# Patient Record
Sex: Male | Born: 1945 | Race: White | Hispanic: No | Marital: Single | Smoking: Never smoker
Health system: Southern US, Community
[De-identification: ages and names within clinical notes are randomized; demographics above are authoritative.]

## PROBLEM LIST (undated history)

## (undated) DIAGNOSIS — R339 Retention of urine, unspecified: Secondary | ICD-10-CM

## (undated) DIAGNOSIS — I10 Essential (primary) hypertension: Secondary | ICD-10-CM

## (undated) DIAGNOSIS — N400 Enlarged prostate without lower urinary tract symptoms: Secondary | ICD-10-CM

## (undated) DIAGNOSIS — F419 Anxiety disorder, unspecified: Secondary | ICD-10-CM

## (undated) DIAGNOSIS — C4490 Unspecified malignant neoplasm of skin, unspecified: Secondary | ICD-10-CM

## (undated) HISTORY — PX: TONSILLECTOMY: SUR1361

## (undated) HISTORY — PX: MELANOMA EXCISION: SHX5266

---

## 2015-07-25 ENCOUNTER — Emergency Department
Admission: EM | Admit: 2015-07-25 | Discharge: 2015-07-25 | Discharge status: Against Medical Advice | Payer: Medicare Other | Attending: Emergency Medicine | Admitting: Emergency Medicine

## 2015-07-25 DIAGNOSIS — R197 Diarrhea, unspecified: Secondary | ICD-10-CM

## 2015-07-25 NOTE — ED Notes (Signed)
Pt to ed with c/o yellow stools and states "im concerned they should be brown" pt states this has been going on for about 4 days, "sometimes liquid, sometimes mushy looking, sometimes liquid with balls in it" per pt denies vomiting or pain

## 2015-07-25 NOTE — ED Provider Notes (Addendum)
[facility] Emergency Department Provider Note  ____________________________________________  Time seen: Approximately 10:10PM  I have reviewed the triage vital signs and the nursing notes.   HISTORY  Chief Complaint Diarrhea    HPI Samuel Pitts is a 69 y.o. male without any chronic medical conditions was presenting tonight for 2-3 episodes of diarrhea per day over the past 5 days. The patient denies any pain. Denies any shortness of breath. Denies any recent antibiotics, travel or hospitalizations. Denies any history of C. difficile. Denies any blood in his stool.Says his last episode of diarrhea was 2 hours ago. He started looking things up on the Internet about diarrhea and became concerned about liver cirrhosis. He denies any drinking or any history of IV drug use.   No past medical history on file.  There are no active problems to display for this patient.   No past surgical history on file.  No current outpatient prescriptions on file.  Allergies Review of patient's allergies indicates no known allergies.  No family history on file.  Social History Social History  Substance Use Topics  . Smoking status: Not on file  . Smokeless tobacco: Not on file  . Alcohol Use: Not on file    Review of Systems Constitutional: No fever/chills Eyes: No visual changes. ENT: No sore throat. Cardiovascular: Denies chest pain. Respiratory: Denies shortness of breath. Gastrointestinal: No abdominal pain.  No nausea, no vomiting.   No constipation. Genitourinary: Negative for dysuria. Musculoskeletal: Negative for back pain. Skin: Negative for rash. Neurological: Negative for headaches, focal weakness or numbness.  10-point ROS otherwise negative.  ____________________________________________   PHYSICAL EXAM:  VITAL SIGNS: ED Triage Vitals  Enc Vitals Group     BP 07/25/15 2028 145/71 mmHg     Pulse Rate 07/25/15 2028 94     Resp  07/25/15 2028 20     Temp 07/25/15 2028 97.5 F (36.4 C)     Temp Source 07/25/15 2028 Oral     SpO2 07/25/15 2028 98 %     Weight 07/25/15 2028 160 lb (72.576 kg)     Height 07/25/15 2028 5\' 9"  (1.753 m)     Head Cir --      Peak Flow --      Pain Score --      Pain Loc --      Pain Edu? --      Excl. in [place]? --     Constitutional: Alert and oriented. Well appearing and in no acute distress. Eyes: Conjunctivae are normal. PERRL. EOMI. Head: Atraumatic. Nose: No congestion/rhinnorhea. Mouth/Throat: Mucous membranes are moist.  Oropharynx non-erythematous. Neck: No stridor.   Cardiovascular: Normal rate, regular rhythm. Grossly normal heart sounds.  Good peripheral circulation. Respiratory: Normal respiratory effort.  No retractions. Lungs CTAB. Gastrointestinal: Soft and nontender. No distention. No abdominal bruits. No CVA tenderness. Musculoskeletal: No lower extremity tenderness nor edema.  No joint effusions. Neurologic:  Normal speech and language. No gross focal neurologic deficits are appreciated. No gait instability. Skin:  Skin is warm, dry and intact. No rash noted. Psychiatric: Mood and affect are normal. Speech and behavior are normal.  ____________________________________________   LABS (all labs ordered are listed, but only abnormal results are displayed)  Labs Reviewed  C DIFFICILE QUICK SCREEN W PCR REFLEX  STOOL CULTURE  GIARDIA, EIA; OVA/PARASITE  CBC WITH DIFFERENTIAL/PLATELET  COMPREHENSIVE METABOLIC PANEL  GI PATHOGEN PANEL BY PCR, STOOL   ____________________________________________  EKG   ____________________________________________  RADIOLOGY  ____________________________________________   PROCEDURES  ____________________________________________   INITIAL IMPRESSION / ASSESSMENT AND PLAN / ED COURSE  Pertinent labs & imaging results that were available during my care of the patient were reviewed by me and considered in my medical  decision making (see chart for details).  ----------------------------------------- 10:38 PM on 07/25/2015 -----------------------------------------  The nurse went into the room to draw blood and the patient became angry that it wasn't done when he was first triaged. He says that he thinks that he should've had blood drawn initially to expedite his emergency department workup. He is now wanting to leave [place]. I discussed with him my suspicion that he may have a serious bacterial infection in his diarrhea including C. difficile. We discussed that these illnesses can progress to kidney failure as well as permanent disability and death. The patient understands this. He is not clinically intoxicated and alert and oriented 3. He does not appear acutely psychotic. He has insight into his illness as well as the consequences of leaving [place]. He has decisional capacity at this time. He will be discharged [facility]. I encouraged him to return to the emergency department at his earliest convenience to continue his workup. At this time he has produced a small amount of stool in the hat in the toilet in the room. It is yellow and liquid like without any obvious blood. I will discuss with the nurse entering into the lab although it is only a very small amount on a small piece of toilet tissue and it does not appear that there will be enough to run tests from this. ____________________________________________   FINAL CLINICAL IMPRESSION(S) / ED DIAGNOSES  Acute diarrhea. Initial visit.    Orbie Pyo, MD 07/25/15 2240  Unable to print out Epic discharge instructions for [facility]. Copy of this form was made and placed on the patient's chart.    Orbie Pyo, MD 07/25/15 [phone]

## 2015-07-25 NOTE — ED Notes (Signed)
Pt left AMA states he isnt going to wait any longer "for nothing to be done" I explained to the pt we were going to do blood work however he refused to allow staff to take his blood

## 2015-07-25 NOTE — ED Notes (Signed)
Patients reports "yellow diarrhea for 4-5 days".

## 2018-09-15 ENCOUNTER — Inpatient Hospital Stay
Admission: EM | Admit: 2018-09-15 | Discharge: 2018-09-19 | DRG: 871 | Disposition: A | Discharge status: Home / Self Care | Payer: Medicare Other | Attending: Internal Medicine | Admitting: Internal Medicine

## 2018-09-15 ENCOUNTER — Other Ambulatory Visit: Payer: Self-pay

## 2018-09-15 ENCOUNTER — Emergency Department: Payer: Medicare Other

## 2018-09-15 DIAGNOSIS — R6521 Severe sepsis with septic shock: Secondary | ICD-10-CM | POA: Diagnosis present

## 2018-09-15 DIAGNOSIS — E872 Acidosis: Secondary | ICD-10-CM | POA: Diagnosis present

## 2018-09-15 DIAGNOSIS — G9341 Metabolic encephalopathy: Secondary | ICD-10-CM | POA: Diagnosis present

## 2018-09-15 DIAGNOSIS — N136 Pyonephrosis: Secondary | ICD-10-CM | POA: Diagnosis present

## 2018-09-15 DIAGNOSIS — A419 Sepsis, unspecified organism: Secondary | ICD-10-CM | POA: Diagnosis not present

## 2018-09-15 DIAGNOSIS — I248 Other forms of acute ischemic heart disease: Secondary | ICD-10-CM | POA: Diagnosis present

## 2018-09-15 DIAGNOSIS — K7200 Acute and subacute hepatic failure without coma: Secondary | ICD-10-CM | POA: Diagnosis present

## 2018-09-15 DIAGNOSIS — D638 Anemia in other chronic diseases classified elsewhere: Secondary | ICD-10-CM | POA: Diagnosis present

## 2018-09-15 DIAGNOSIS — Z79899 Other long term (current) drug therapy: Secondary | ICD-10-CM | POA: Diagnosis not present

## 2018-09-15 DIAGNOSIS — N19 Unspecified kidney failure: Secondary | ICD-10-CM

## 2018-09-15 DIAGNOSIS — Z85820 Personal history of malignant melanoma of skin: Secondary | ICD-10-CM | POA: Diagnosis not present

## 2018-09-15 DIAGNOSIS — R6520 Severe sepsis without septic shock: Secondary | ICD-10-CM

## 2018-09-15 DIAGNOSIS — Z888 Allergy status to other drugs, medicaments and biological substances status: Secondary | ICD-10-CM | POA: Diagnosis not present

## 2018-09-15 DIAGNOSIS — R627 Adult failure to thrive: Secondary | ICD-10-CM | POA: Diagnosis present

## 2018-09-15 DIAGNOSIS — E876 Hypokalemia: Secondary | ICD-10-CM | POA: Diagnosis present

## 2018-09-15 DIAGNOSIS — J189 Pneumonia, unspecified organism: Secondary | ICD-10-CM

## 2018-09-15 DIAGNOSIS — I959 Hypotension, unspecified: Secondary | ICD-10-CM | POA: Diagnosis present

## 2018-09-15 DIAGNOSIS — R509 Fever, unspecified: Secondary | ICD-10-CM

## 2018-09-15 DIAGNOSIS — A4151 Sepsis due to Escherichia coli [E. coli]: Secondary | ICD-10-CM | POA: Diagnosis present

## 2018-09-15 DIAGNOSIS — N179 Acute kidney failure, unspecified: Secondary | ICD-10-CM | POA: Diagnosis present

## 2018-09-15 HISTORY — DX: Unspecified malignant neoplasm of skin, unspecified: C44.90

## 2018-09-15 LAB — URINALYSIS, COMPLETE (UACMP) WITH MICROSCOPIC
BILIRUBIN URINE: NEGATIVE
Glucose, UA: NEGATIVE mg/dL
Ketones, ur: NEGATIVE mg/dL
Nitrite: NEGATIVE
PROTEIN: 30 mg/dL — AB
Specific Gravity, Urine: 1.005 (ref 1.005–1.030)
Squamous Epithelial / HPF: NONE SEEN (ref 0–5)
WBC, UA: >50 WBC/hpf — ABNORMAL HIGH (ref 0–5)
pH: 6 (ref 5.0–8.0)

## 2018-09-15 LAB — BLOOD GAS, VENOUS
ACID-BASE DEFICIT: 5.5 mmol/L — AB (ref 0.0–2.0)
Bicarbonate: 17.7 mmol/L — ABNORMAL LOW (ref 20.0–28.0)
O2 Saturation: 71.3 %
Patient temperature: 37
pCO2, Ven: 28 mmHg — ABNORMAL LOW (ref 44.0–60.0)
pH, Ven: 7.41 (ref 7.250–7.430)
pO2, Ven: 37 mmHg (ref 32.0–45.0)

## 2018-09-15 LAB — COMPREHENSIVE METABOLIC PANEL
ALT: 66 U/L — ABNORMAL HIGH (ref 0–44)
AST: 84 U/L — ABNORMAL HIGH (ref 15–41)
Albumin: 2.3 g/dL — ABNORMAL LOW (ref 3.5–5.0)
Alkaline Phosphatase: 488 U/L — ABNORMAL HIGH (ref 38–126)
Anion gap: 18 — ABNORMAL HIGH (ref 5–15)
BUN: 67 mg/dL — ABNORMAL HIGH (ref 8–23)
CO2: 17 mmol/L — ABNORMAL LOW (ref 22–32)
Calcium: 7.7 mg/dL — ABNORMAL LOW (ref 8.9–10.3)
Chloride: 101 mmol/L (ref 98–111)
Creatinine, Ser: 5.35 mg/dL — ABNORMAL HIGH (ref 0.61–1.24)
GFR calc Af Amer: 11 mL/min — ABNORMAL LOW (ref >60)
GFR, EST NON AFRICAN AMERICAN: 10 mL/min — AB (ref >60)
Glucose, Bld: 113 mg/dL — ABNORMAL HIGH (ref 70–99)
Potassium: 2.9 mmol/L — ABNORMAL LOW (ref 3.5–5.1)
Sodium: 136 mmol/L (ref 135–145)
TOTAL PROTEIN: 6.6 g/dL (ref 6.5–8.1)
Total Bilirubin: 2.1 mg/dL — ABNORMAL HIGH (ref 0.3–1.2)

## 2018-09-15 LAB — CBC WITH DIFFERENTIAL/PLATELET
Abs Immature Granulocytes: 0.5 10*3/uL — ABNORMAL HIGH (ref 0.00–0.07)
Band Neutrophils: 48 %
Basophils Absolute: 0 10*3/uL (ref 0.0–0.1)
Basophils Relative: 0 %
EOS ABS: 0 10*3/uL (ref 0.0–0.5)
Eosinophils Relative: 0 %
HEMATOCRIT: 36.3 % — AB (ref 39.0–52.0)
HEMOGLOBIN: 12 g/dL — AB (ref 13.0–17.0)
Lymphocytes Relative: 3 %
Lymphs Abs: 0.3 10*3/uL — ABNORMAL LOW (ref 0.7–4.0)
MCH: 29.1 pg (ref 26.0–34.0)
MCHC: 33.1 g/dL (ref 30.0–36.0)
MCV: 87.9 fL (ref 80.0–100.0)
Metamyelocytes Relative: 4 %
Monocytes Absolute: 0 10*3/uL — ABNORMAL LOW (ref 0.1–1.0)
Monocytes Relative: 0 %
Myelocytes: 1 %
Neutro Abs: 9.7 10*3/uL — ABNORMAL HIGH (ref 1.7–7.7)
Neutrophils Relative %: 44 %
Platelets: 248 10*3/uL (ref 150–400)
RBC: 4.13 MIL/uL — AB (ref 4.22–5.81)
RDW: 14.3 % (ref 11.5–15.5)
WBC: 10.5 10*3/uL (ref 4.0–10.5)
nRBC: 0 % (ref 0.0–0.2)

## 2018-09-15 LAB — INFLUENZA PANEL BY PCR (TYPE A & B)
Influenza A By PCR: NEGATIVE
Influenza B By PCR: NEGATIVE

## 2018-09-15 LAB — PROTIME-INR
INR: 1.42
Prothrombin Time: 17.2 seconds — ABNORMAL HIGH (ref 11.4–15.2)

## 2018-09-15 LAB — CG4 I-STAT (LACTIC ACID): Lactic Acid, Venous: 6.19 mmol/L (ref 0.5–1.9)

## 2018-09-15 LAB — CK: Total CK: 26 U/L — ABNORMAL LOW (ref 49–397)

## 2018-09-15 LAB — TROPONIN I: Troponin I: 0.18 ng/mL (ref <0.03)

## 2018-09-15 MED ORDER — DOCUSATE SODIUM 100 MG PO CAPS
100.0000 mg | ORAL_CAPSULE | Freq: Two times a day (BID) | ORAL | Status: DC
  Administered 2018-09-17 – 2018-09-19 (×3): 100 mg via ORAL
  Filled 2018-09-15 (×4): qty 1

## 2018-09-15 MED ORDER — ACETAMINOPHEN 650 MG RE SUPP
650.0000 mg | Freq: Once | RECTAL | Status: AC
  Administered 2018-09-15: 650 mg via RECTAL
  Filled 2018-09-15: qty 1

## 2018-09-15 MED ORDER — SODIUM CHLORIDE 0.9 % IV BOLUS (SEPSIS)
1000.0000 mL | Freq: Once | INTRAVENOUS | Status: AC
  Administered 2018-09-15: 1000 mL via INTRAVENOUS

## 2018-09-15 MED ORDER — ONDANSETRON HCL 4 MG/2ML IJ SOLN: 4.0000 mg | Freq: Four times a day (QID) | INTRAMUSCULAR | Status: DC | PRN

## 2018-09-15 MED ORDER — ACETAMINOPHEN 650 MG RE SUPP: 650.0000 mg | Freq: Four times a day (QID) | RECTAL | Status: DC | PRN

## 2018-09-15 MED ORDER — SODIUM CHLORIDE 0.9 % IV BOLUS (SEPSIS): 250.0000 mL | Freq: Once | INTRAVENOUS | Status: DC

## 2018-09-15 MED ORDER — HYDROCODONE-ACETAMINOPHEN 5-325 MG PO TABS: 1.0000 | ORAL_TABLET | ORAL | Status: DC | PRN

## 2018-09-15 MED ORDER — TRAZODONE HCL 50 MG PO TABS
25.0000 mg | ORAL_TABLET | Freq: Every evening | ORAL | Status: DC | PRN
  Administered 2018-09-18: 25 mg via ORAL
  Filled 2018-09-15: qty 1

## 2018-09-15 MED ORDER — HEPARIN SODIUM (PORCINE) 5000 UNIT/ML IJ SOLN
5000.0000 [IU] | Freq: Three times a day (TID) | INTRAMUSCULAR | Status: DC
  Administered 2018-09-16 – 2018-09-19 (×10): 5000 [IU] via SUBCUTANEOUS
  Filled 2018-09-15 (×12): qty 1

## 2018-09-15 MED ORDER — SODIUM CHLORIDE 0.9 % IV BOLUS: 500.0000 mL | Freq: Once | INTRAVENOUS | Status: DC

## 2018-09-15 MED ORDER — SODIUM CHLORIDE 0.9 % IV SOLN
2.0000 g | Freq: Once | INTRAVENOUS | Status: AC
  Administered 2018-09-15: 2 g via INTRAVENOUS
  Filled 2018-09-15: qty 2

## 2018-09-15 MED ORDER — BISACODYL 5 MG PO TBEC: 5.0000 mg | DELAYED_RELEASE_TABLET | Freq: Every day | ORAL | Status: DC | PRN

## 2018-09-15 MED ORDER — SODIUM CHLORIDE 0.9 % IV SOLN
INTRAVENOUS | Status: DC
  Administered 2018-09-16 (×3): via INTRAVENOUS
  Administered 2018-09-16 – 2018-09-17 (×2): 150 mL/h via INTRAVENOUS
  Administered 2018-09-17: 04:00:00 via INTRAVENOUS
  Administered 2018-09-17: 150 mL/h via INTRAVENOUS
  Administered 2018-09-18 – 2018-09-19 (×2): via INTRAVENOUS

## 2018-09-15 MED ORDER — METRONIDAZOLE IN NACL 5-0.79 MG/ML-% IV SOLN
500.0000 mg | Freq: Three times a day (TID) | INTRAVENOUS | Status: DC
  Administered 2018-09-15 – 2018-09-16 (×2): 500 mg via INTRAVENOUS
  Filled 2018-09-15 (×4): qty 100

## 2018-09-15 MED ORDER — ACETAMINOPHEN 325 MG PO TABS
650.0000 mg | ORAL_TABLET | Freq: Four times a day (QID) | ORAL | Status: DC | PRN
  Administered 2018-09-16 – 2018-09-19 (×3): 650 mg via ORAL
  Filled 2018-09-15 (×3): qty 2

## 2018-09-15 MED ORDER — VANCOMYCIN HCL IN DEXTROSE 1-5 GM/200ML-% IV SOLN
1000.0000 mg | Freq: Once | INTRAVENOUS | Status: AC
  Administered 2018-09-15: 1000 mg via INTRAVENOUS
  Filled 2018-09-15: qty 200

## 2018-09-15 MED ORDER — ONDANSETRON HCL 4 MG PO TABS: 4.0000 mg | ORAL_TABLET | Freq: Four times a day (QID) | ORAL | Status: DC | PRN

## 2018-09-15 MED ORDER — SODIUM CHLORIDE 0.9 % IV SOLN
1.0000 g | INTRAVENOUS | Status: DC
  Filled 2018-09-15: qty 1

## 2018-09-15 MED ORDER — SODIUM CHLORIDE 0.9 % IV BOLUS
750.0000 mL | Freq: Once | INTRAVENOUS | Status: AC
  Administered 2018-09-15: 750 mL via INTRAVENOUS

## 2018-09-15 NOTE — Progress Notes (Signed)
Pharmacy Antibiotic Note  Samuel Pitts is a 72 y.o. male admitted on 09/15/2018 with sepsis s/t UTI.  Pharmacy has been consulted for vanc/cefepime dosing. Patient admitted w/ weakness and meeting 3/4 SIRS + qSOFA 3/3 (systolic < 590, RR > 22, AMS) and LA of 6.19 and is in AKI (baseline Scr 0.93 from [facility] -- here it is 5.35). Patient received vanc 1g, cefepime 2g, and flagyl 500 mg IV x 1  Plan: Considering patient has AKI will draw vanc random w/ am labs and will pulse dose for now until renal function can stabilize  Will dose w/ vanc 15 mg/kg IV x 1 if random at goal < 20 mcg/mL. Will continue cefepime 1g IV q24h per CrCl 11 - 29 ml/min  Height: 6' (182.9 cm) Weight: 165 lb (74.8 kg) IBW/kg (Calculated) : 77.6  Temp (24hrs), Avg:102.2 F (39 C), Min:101.2 F (38.4 C), Max:104.8 F (40.4 C)  Recent Labs  Lab 09/15/18 2123 09/15/18 2129  WBC  --  10.5  CREATININE  --  5.35*  LATICACIDVEN 6.19*  --     Estimated Creatinine Clearance: 13 mL/min (A) (by C-G formula based on SCr of 5.35 mg/dL (H)).    Allergies  Allergen Reactions  . Omeprazole     SEVERE STOMACH PAIN    Thank you for allowing pharmacy to be a part of this patient's care.  Tobie Lords, PharmD, BCPS Clinical Pharmacist 09/15/2018

## 2018-09-15 NOTE — ED Notes (Signed)
Pt cleansed of incontinent stool. Liquid dark brown diarrhea noted.

## 2018-09-15 NOTE — ED Provider Notes (Addendum)
[facility] Emergency Department Provider Note  ____________________________________________   I have reviewed the triage vital signs and the nursing notes. Where available I have reviewed prior notes and, if possible and indicated, outside hospital notes.    HISTORY  Chief Complaint Weakness    HPI Samuel Pitts is a 72 y.o. male presents today with EMS.  He is a very limited historian.  He states he has been feeling weak for the last week but does not really provide any other information.  EMS states that he he was dying on the couch.  No trauma.  He was found to be febrile.  Family had very poor explanation for what is been going on.  Patient apparently had been lying in his own urine and fecal matter.  Unclear how long he has been like that.  Patient cannot give a reliable history.  States he has been there for "a week" he denies any other symptoms he denies chest pain shortness of breath nausea vomiting diarrhea.  Rhinorrhea, headache stiff neck etc.  It is unclear exactly what this patient's baseline is.  No family are with him.  EMS states that the family were not helpful in the history in any event.  Initial blood pressure was low for EMS they gave him 750 cc of IV fluid.   History reviewed. No pertinent past medical history.  There are no active problems to display for this patient.   History reviewed. No pertinent surgical history.  Prior to Admission medications   Not on File    Allergies Patient has no known allergies.  History reviewed. No pertinent family history.  Social History Social History   Tobacco Use  . Smoking status: Never Smoker  . Smokeless tobacco: Never Used  Substance Use Topics  . Alcohol use: Never    Frequency: Never  . Drug use: Never    Review of Systems Constitutional: Positive fever today eyes: No visual changes. ENT: No sore throat. No stiff neck no neck pain Cardiovascular: Denies chest  pain. Respiratory: Denies shortness of breath. Gastrointestinal:   no vomiting.  No diarrhea.  No constipation. Genitourinary: Negative for dysuria. Musculoskeletal: Negative lower extremity swelling Skin: Negative for rash. Neurological: Negative for severe headaches, focal weakness or numbness. Unclear how reliable this history is but this is what he tells me  ____________________________________________   PHYSICAL EXAM:  VITAL SIGNS: ED Triage Vitals  Enc Vitals Group     BP 09/15/18 2119 (!) 89/44     Pulse Rate 09/15/18 2120 (!) 135     Resp 09/15/18 2119 (!) 21     Temp 09/15/18 2132 (!) 104.8 F (40.4 C)     Temp Source 09/15/18 2132 Rectal     SpO2 09/15/18 2119 98 %     Weight 09/15/18 2124 165 lb (74.8 kg)     Height 09/15/18 2124 6' (1.829 m)     Head Circumference --      Peak Flow --      Pain Score --      Pain Loc --      Pain Edu? --      Excl. in [place]? --     Constitutional: Alert and oriented to name and place unsure of the date. Well appearing and in no acute distress.  Feels warm.  Will follow commands and answer questions.  Asking about his wife. Eyes: Conjunctivae are normal Head: Atraumatic HEENT: No congestion/rhinnorhea. Mucous membranes are very dry.  Oropharynx non-erythematous Neck:  Nontender with no meningismus, no masses, no stridor Cardiovascular: Tachycardia noted.  Grossly normal heart sounds.  Good peripheral circulation. Respiratory: Normal respiratory effort.  No retractions. Lungs CTAB. Abdominal: Soft and left lower quadrant discomfort nonsurgical. No distention. No guarding no rebound Back:  There is no focal tenderness or step off.  there is no midline tenderness there are no lesions noted. there is no CVA tenderness External male genitalia no obvious lesions noted Musculoskeletal: No lower extremity tenderness, no upper extremity tenderness. No joint effusions, no DVT signs strong distal pulses no edema Neurologic:  Normal speech  and language. No gross focal neurologic deficits are appreciated.  Skin:  Skin is warm, dry and intact. No rash noted.   ____________________________________________   LABS (all labs ordered are listed, but only abnormal results are displayed)  Labs Reviewed  CG4 I-STAT (LACTIC ACID) - Abnormal; Notable for the following components:      Result Value   Lactic Acid, Venous 6.19 (*)    All other components within normal limits  CULTURE, BLOOD (ROUTINE X 2)  CULTURE, BLOOD (ROUTINE X 2)  URINE CULTURE  C DIFFICILE QUICK SCREEN W PCR REFLEX  GASTROINTESTINAL PANEL BY PCR, STOOL (REPLACES STOOL CULTURE)  CK  LACTIC ACID, PLASMA  LACTIC ACID, PLASMA  COMPREHENSIVE METABOLIC PANEL  CBC WITH DIFFERENTIAL/PLATELET  URINALYSIS, ROUTINE W REFLEX MICROSCOPIC  BLOOD GAS, VENOUS  PROTIME-INR  URINALYSIS, COMPLETE (UACMP) WITH MICROSCOPIC  TROPONIN I  INFLUENZA PANEL BY PCR (TYPE A & B)    Pertinent labs  results that were available during my care of the patient were reviewed by me and considered in my medical decision making (see chart for details). ____________________________________________  EKG  I personally interpreted any EKGs ordered by me or triage Tachycardia noted, sinus rate 138, normal axis likely rate related changes no acute ischemia ____________________________________________  RADIOLOGY  Pertinent labs & imaging results that were available during my care of the patient were reviewed by me and considered in my medical decision making (see chart for details). If possible, patient and/or family made aware of any abnormal findings.  No results found. ____________________________________________    PROCEDURES  Procedure(s) performed: None  Procedures  Critical Care performed: CRITICAL CARE Performed by: Schuyler Amor   Total critical care time: 60 minutes  Critical care time was exclusive of separately billable procedures and treating other  patients.  Critical care was necessary to treat or prevent imminent or life-threatening deterioration.  Critical care was time spent personally by me on the following activities: development of treatment plan with patient and/or surrogate as well as nursing, discussions with consultants, evaluation of patient's response to treatment, examination of patient, obtaining history from patient or surrogate, ordering and performing treatments and interventions, ordering and review of laboratory studies, ordering and review of radiographic studies, pulse oximetry and re-evaluation of patient's condition.   ____________________________________________   INITIAL IMPRESSION / ASSESSMENT AND PLAN / ED COURSE  Pertinent labs & imaging results that were available during my care of the patient were reviewed by me and considered in my medical decision making (see chart for details).  Patient here after being "weak" for a week, unclear exactly what other symptoms he has been having as he is very vague about this.  He has had some diarrhea since he is been here we will send that for C. difficile and bio fire if we get another sample.  He is apparently septic he has a significant tachycardia initial blood pressure for EMS  was quite low, they have already given him 750 cc of fluid.  I have initiated aggressive sepsis protocol, we will give him a full bolus here, I have initiated antibiotics, broad-spectrum for sepsis, will get a chest x-ray.  He does have some slight tenderness in the left lower quadrant I will also do a CT scan when he is stable enough to go over. Patient is significantly ill and will require admission.  I will place a Foley catheter to keep track of I's and O's  ----------------------------------------- 11:26 PM on 09/15/2018 -----------------------------------------  Patient's heart rate is down to the 120s at this time, he is still mentating well, has evidence of urinary tract infection acute  kidney injury evidence of shock liver, evidence of demand ischemia evidence of severe sepsis.  However, blood pressure is trending up with IV fluids he is making urine, I think this is likely urinary origin.  CT scan reviewed.  She has been admitted to the hospitalist service for ICU care.  No indication for pressors at this time.  He has been aggressively IV hydrated.  We will continue to do so he is under the care of the hospitalist service Dr. Doyle Askew.   ____________________________________________   FINAL CLINICAL IMPRESSION(S) / ED DIAGNOSES  Final diagnoses:  Fever      This chart was dictated using voice recognition software.  Despite best efforts to proofread,  errors can occur which can change meaning.      Schuyler Amor, MD 09/15/18 2154    Schuyler Amor, MD 09/15/18 2328

## 2018-09-15 NOTE — ED Notes (Signed)
Pt leaving for imaging.

## 2018-09-15 NOTE — ED Notes (Addendum)
2 sets of cultures taken. L arm pulled by April, RN. R arm pulled by 2nd float RN. Rainbow pulled. Flu panel sent.

## 2018-09-15 NOTE — ED Notes (Signed)
Foley cath in place; April & Lorrie, RN.

## 2018-09-15 NOTE — Progress Notes (Signed)
CODE SEPSIS - PHARMACY COMMUNICATION  **Broad Spectrum Antibiotics should be administered within 1 hour of Sepsis diagnosis**  Time Code Sepsis Called/Page Received:  12/6 @ 21:30   Antibiotics Ordered:  Vancomycin, Cefepime , Metronidazole   Time of 1st antibiotic administration:  12/6 @ 21:52   Additional action taken by pharmacy:   If necessary, Name of Provider/Nurse Contacted:      Rainee Sweatt D ,PharmD Clinical Pharmacist  09/15/2018  10:31 PM

## 2018-09-15 NOTE — ED Triage Notes (Signed)
Pt brought in via EMS from home. Recently dx with H pylori. Had fever when picked up. BP 83/53, HR 140, RR 40, BG 123 per EMS. Pt sitting in own feces. 750cc bolus given by EMS. Pt alert but not a good historian.

## 2018-09-15 NOTE — ED Notes (Signed)
EDP McShane notified in person of Trop 0.18.

## 2018-09-15 NOTE — H&P (Addendum)
[facility] [place] NAME: Samuel Pitts    MR#:  [id number]  DATE OF BIRTH:  1946/09/28  DATE OF ADMISSION:  09/15/2018  PRIMARY CARE PHYSICIAN: Patient, No Pcp Per   REQUESTING/REFERRING PHYSICIAN:   CHIEF COMPLAINT:   Chief Complaint  Patient presents with  . Weakness    HISTORY OF PRESENT ILLNESS: Samuel Pitts  is a 72 y.o. male with a known history of skin cancer. Patient is unable to provide history, due to altered mental status.  Information was taken from reviewing the medical records and from discussion with emergency room physician and EMS team. Patient was brought to emergency room for altered mental status and lethargy.  Paramedics found him laying on a couch at home, in his own urine and fecal matter.  Per EMS report, the family member who was with the patient at home seemed to have some sort of mental disability, unable to provide history. At the arrival to emergency room patient was found with tachycardia, fever (101.7), tachypnea, confusion and lethargy.  Blood pressure was on the low side at 100/50. Blood test done in emergency room are notable for elevated creatinine level at 5.35, potassium 2.9, troponin 0 0.18, lactic acid level at 6.  Liver function tests are mildly elevated as well, including AST ALT and bilirubin level.  UA strongly positive for UTI. Marked bilateral hydroureteronephrosis is noted per CAT scan, extending to the urinary bladder where it is circumferentially thick-walled up to 12 mm.   She is admitted to ICU for further evaluation and management.  PAST MEDICAL HISTORY:  History reviewed. No pertinent past medical history.  PAST SURGICAL HISTORY: History reviewed. No pertinent surgical history.  SOCIAL HISTORY:  Social History   Tobacco Use  . Smoking status: Never Smoker  . Smokeless tobacco: Never Used  Substance Use Topics  . Alcohol use: Never    Frequency: Never    FAMILY  HISTORY: History reviewed. No pertinent family history.  DRUG ALLERGIES:  Allergies  Allergen Reactions  . Omeprazole     SEVERE STOMACH PAIN    REVIEW OF SYSTEMS:   Unable to obtain due to patient being confused, lethargic.  MEDICATIONS AT HOME:  Prior to Admission medications   Medication Sig Start Date End Date Taking? Authorizing Provider  amoxicillin (AMOXIL) 500 MG capsule Take 1,000 mg by mouth 2 (two) times daily.    [provider]  clarithromycin (BIAXIN) 500 MG tablet Take 500 mg by mouth 2 (two) times daily. 14 DAYS    [provider]  metroNIDAZOLE (FLAGYL) 500 MG tablet Take 500 mg by mouth 2 (two) times daily.    [provider]  omeprazole (PRILOSEC) 40 MG capsule Take 40 mg by mouth daily.    [provider]      PHYSICAL EXAMINATION:   VITAL SIGNS: Blood pressure (!) 107/59, pulse (!) 133, temperature (!) 101.7 F (38.7 C), resp. rate (!) 26, height 6' (1.829 m), weight 74.8 kg, SpO2 98 %.  GENERAL:  72 y.o.-year-old patient lying in the bed, confused, lethargic.  EYES: Pupils equal, round, reactive to light and accommodation.  Bilateral scleral icterus noted.  HEENT: Head atraumatic, normocephalic. Oropharynx and nasopharynx clear.  NECK:  Supple, no jugular venous distention. No thyroid enlargement, no tenderness.  LUNGS: Tachypnea present.  Reduced breath sounds bilaterally, no wheezing, rales,rhonchi or crepitation. No use of accessory muscles of respiration.  CARDIOVASCULAR: Tachycardia present.  S1, S2 normal. No S3/S4.  ABDOMEN: Soft, nontender, nondistended. Bowel sounds present. No organomegaly or mass.  EXTREMITIES: No pedal edema, cyanosis, or clubbing.  NEUROLOGIC EXAM: Is limited, due to patient's altered mental status.  He is moving all his extremities, no focal weakness appreciated. PSYCHIATRIC: The patient is confused, lethargic.  SKIN: Skin is jaundiced.  No obvious rash, lesion, or ulcer.   LABORATORY  PANEL:   CBC Recent Labs  Lab 09/15/18 2129  WBC 10.5  HGB 12.0*  HCT 36.3*  PLT 248  MCV 87.9  MCH 29.1  MCHC 33.1  RDW 14.3  LYMPHSABS 0.3*  MONOABS 0.0*  EOSABS 0.0  BASOSABS 0.0   ------------------------------------------------------------------------------------------------------------------  Chemistries  Recent Labs  Lab 09/15/18 2129  NA 136  K 2.9*  CL 101  CO2 17*  GLUCOSE 113*  BUN 67*  CREATININE 5.35*  CALCIUM 7.7*  AST 84*  ALT 66*  ALKPHOS 488*  BILITOT 2.1*   ------------------------------------------------------------------------------------------------------------------ estimated creatinine clearance is 13 mL/min (A) (by C-G formula based on SCr of 5.35 mg/dL (H)). ------------------------------------------------------------------------------------------------------------------ No results for input(s): TSH, T4TOTAL, T3FREE, THYROIDAB in the last 72 hours.  Invalid input(s): FREET3   Coagulation profile Recent Labs  Lab 09/15/18 2129  INR 1.42   ------------------------------------------------------------------------------------------------------------------- No results for input(s): DDIMER in the last 72 hours. -------------------------------------------------------------------------------------------------------------------  Cardiac Enzymes Recent Labs  Lab 09/15/18 2129  TROPONINI 0.18*   ------------------------------------------------------------------------------------------------------------------ Invalid input(s): POCBNP  ---------------------------------------------------------------------------------------------------------------  Urinalysis    Component Value Date/Time   COLORURINE YELLOW (A) 09/15/2018 2202   APPEARANCEUR CLOUDY (A) 09/15/2018 2202   LABSPEC 1.005 09/15/2018 2202   PHURINE 6.0 09/15/2018 2202   GLUCOSEU NEGATIVE 09/15/2018 2202   HGBUR MODERATE (A) 09/15/2018 2202   BILIRUBINUR NEGATIVE  09/15/2018 2202   KETONESUR NEGATIVE 09/15/2018 2202   PROTEINUR 30 (A) 09/15/2018 2202   NITRITE NEGATIVE 09/15/2018 2202   LEUKOCYTESUR LARGE (A) 09/15/2018 2202     RADIOLOGY: Ct Chest Wo Contrast  Result Date: 09/15/2018 CLINICAL DATA:  Weakness for the past week with fever. Patient has apparently been laying in his own urine and feces for on certain length of time. EXAM: CT CHEST, ABDOMEN AND PELVIS WITHOUT CONTRAST TECHNIQUE: Multidetector CT imaging of the chest, abdomen and pelvis was performed following the standard protocol without IV contrast. COMPARISON:  None. FINDINGS: CT CHEST FINDINGS Cardiovascular: Conventional branch pattern of the great vessels. Atherosclerosis is noted at the origin of the left subclavian artery. 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 Normal heart size without pericardial effusion. Left main and three-vessel coronary arteriosclerosis predominantly along the left main and LAD. The unenhanced pulmonary vasculature is unremarkable. Mediastinum/Nodes: There is mediastinal shift to the right from volume loss. No lymphadenopathy. Trachea and mainstem bronchi appear patent. The esophagus is unremarkable. The thyroid gland is not enlarged. 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 No pneumothorax. Subpleural areas of scarring at the lung bases. No dominant mass. Minimal scarring at the left lung apex. Musculoskeletal: Remote right-sided rib fractures. No acute nor suspicious osseous lesions. CT ABDOMEN PELVIS FINDINGS Hepatobiliary: No focal liver abnormality is seen. No gallstones, gallbladder wall thickening, or biliary dilatation. Pancreas: Unremarkable. No pancreatic ductal dilatation or surrounding inflammatory changes. Spleen: Normal in size without focal abnormality. Adrenals/Urinary Tract: Normal bilateral adrenal glands. Marked bilateral  hydroureteronephrosis extending to the urinary bladder where it is circumferentially thick-walled in appearance up to 12 mm. The urinary bladder is decompressed by an indwelling  Foley catheter. A discrete mass or calculus is not identified however. Stomach/Bowel: The stomach is physiologically distended. Normal small bowel rotation without obstruction or inflammation. Distal and terminal ileum are unremarkable. Normal appearing appendix is visualized. No bowel obstruction or inflammation. Vascular/Lymphatic: Aortoiliac atherosclerosis. No aneurysm. No adenopathy. Reproductive: Within normal limits appearance of the prostate gland. Seminal vesicles are unremarkable. Other: Fat containing inguinal hernia on the left. No abdominopelvic ascites. Musculoskeletal: No aggressive osseous lesions. 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 IMPRESSION: 1. 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 2. 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 Recommend annual imaging followup by CTA or MRA. This recommendation follows 2010 ACCF/AHA/AATS/ACR/ASA/SCA/SCAI/SIR/STS/SVM Guidelines for the Diagnosis and Management of Patients with Thoracic Aortic Disease. 2010; 121: Z660-Y301. 3. Marked bilateral hydroureteronephrosis extending to the urinary bladder where it is circumferentially thick-walled up to 12 mm. The urinary bladder is decompressed by indwelling Foley catheter. A discrete mass or calculus is not identified however. Findings may be due to mural thickening possibly from chronic cystitis. 4. Fat containing left inguinal hernia. 5. 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 Aortic Atherosclerosis (ICD10-I70.0). Electronically Signed   By: Ashley Royalty M.D.   On: 09/15/2018 23:20   Dg Chest Port 1 View  Result Date: 09/15/2018 CLINICAL DATA:  Fever. EXAM: PORTABLE CHEST 1 VIEW COMPARISON:  None. FINDINGS: Cardiomediastinal silhouette  is normal. Trachea shifted to the RIGHT with RIGHT apical pleural thickening. Bilateral calcified pleural plaques and RIGHT lung pleural thickening. No pleural effusion or focal consolidation. Small potential pneumothorax. Soft tissue planes and included osseous structures are non suspicious. IMPRESSION: 1. Small potential RIGHT pneumothorax. Calcified pleural plaques which may obscure underlying acute process. RIGHT apical pleural thickening and scarring. 2. Acute findings discussed with and reconfirmed by Dr.JAMES MCSHANE on 09/15/2018 at 10:19 pm. Electronically Signed   By: Elon Alas M.D.   On: 09/15/2018 22:22   Ct Renal Stone Study  Result Date: 09/15/2018 CLINICAL DATA:  Weakness for the past week with fever. Patient has apparently been laying in his own urine and feces for on certain length of time. EXAM: CT CHEST, ABDOMEN AND PELVIS WITHOUT CONTRAST TECHNIQUE: Multidetector CT imaging of the chest, abdomen and pelvis was performed following the standard protocol without IV contrast. COMPARISON:  None. FINDINGS: CT CHEST FINDINGS Cardiovascular: Conventional branch pattern of the great vessels. Atherosclerosis is noted at the origin of the left subclavian artery. 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 Normal heart size without pericardial effusion. Left main and three-vessel coronary arteriosclerosis predominantly along the left main and LAD. The unenhanced pulmonary vasculature is unremarkable. Mediastinum/Nodes: There is mediastinal shift to the right from volume loss. No lymphadenopathy. Trachea and mainstem bronchi appear patent. The esophagus is unremarkable. The thyroid gland is not enlarged. 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 No pneumothorax. Subpleural areas of scarring at the lung bases. No dominant mass. Minimal scarring at the left lung apex.  Musculoskeletal: Remote right-sided rib fractures. No acute nor suspicious osseous lesions. CT ABDOMEN PELVIS FINDINGS Hepatobiliary: No focal liver abnormality is seen. No gallstones, gallbladder wall thickening, or biliary dilatation. Pancreas: Unremarkable. No pancreatic ductal dilatation or surrounding inflammatory changes. Spleen: Normal in size without focal abnormality. Adrenals/Urinary Tract: Normal bilateral adrenal glands. Marked bilateral hydroureteronephrosis extending to the urinary bladder where it is circumferentially thick-walled in appearance up to 12  mm. The urinary bladder is decompressed by an indwelling Foley catheter. A discrete mass or calculus is not identified however. Stomach/Bowel: The stomach is physiologically distended. Normal small bowel rotation without obstruction or inflammation. Distal and terminal ileum are unremarkable. Normal appearing appendix is visualized. No bowel obstruction or inflammation. Vascular/Lymphatic: Aortoiliac atherosclerosis. No aneurysm. No adenopathy. Reproductive: Within normal limits appearance of the prostate gland. Seminal vesicles are unremarkable. Other: Fat containing inguinal hernia on the left. No abdominopelvic ascites. Musculoskeletal: No aggressive osseous lesions. 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 IMPRESSION: 1. 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 2. 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 Recommend annual imaging followup by CTA or MRA. This recommendation follows 2010 ACCF/AHA/AATS/ACR/ASA/SCA/SCAI/SIR/STS/SVM Guidelines for the Diagnosis and Management of Patients with Thoracic Aortic Disease. 2010; 121: Z610-R604. 3. Marked bilateral hydroureteronephrosis extending to the urinary bladder where it is circumferentially thick-walled up to 12 mm. The urinary bladder is decompressed by indwelling Foley catheter. A discrete mass or calculus  is not identified however. Findings may be due to mural thickening possibly from chronic cystitis. 4. Fat containing left inguinal hernia. 5. 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 Aortic Atherosclerosis (ICD10-I70.0). Electronically Signed   By: Ashley Royalty M.D.   On: 09/15/2018 23:20    EKG: Orders placed or performed during the hospital encounter of 09/15/18  . ED EKG 12-Lead  . ED EKG 12-Lead  . EKG 12-Lead  . EKG 12-Lead    IMPRESSION AND PLAN:  1.  Sepsis, likely secondary to urinary infection.  We will start treatment with IV fluids and broad-spectrum IV antibiotics; oxygen as needed to maintain oxygen saturation above 92%.  Continue to follow urine and blood cultures results and lactic acid levels. 2.  Acute UTI with bilateral hydroureteronephrosis.  Will start treatment with broad-spectrum IV antibiotics, IV fluids.  Follow urine culture results.  Will consult urology and nephrology for further evaluation and treatment. 3.  Acute renal failure.  Creatinine level is markedly elevated at 5.35 in this patient with normal creatinine level at baseline.  This is likely secondary to urinary infection and bilateral hydroureteronephrosis.  We will start IV fluids and IV antibiotics and evaluate patient for urinary tract obstructive etiology. 4.  Acute metabolic encephalopathy, secondary to urinary infection and uremia from acute renal failure.  Continue to monitor clinically closely while treating the underlying disorder. 5.  Elevated troponin level, at 0.18.  This likely related to demand ischemia from sepsis.  No acute ischemic changes noted per EKG.  We will continue to monitor patient on telemetry and follow troponin levels to rule out ACS. 6.  Shock liver, secondary to sepsis.  We will continue to monitor liver function test, while treating the sepsis. 7.  Hypokalemia.  Will replace potassium per protocol.  All the records are reviewed and case discussed with ED and ICU  providers. Management plans discussed with the patient, family and they are in agreement.  CODE STATUS: Full Advance Directive Documentation     Most Recent Value  Type of Advance Directive  Healthcare Power of Attorney  Pre-existing out of facility DNR order (yellow form or pink MOST form)  -  "MOST" Form in Place?  -       TOTAL TIME TAKING CARE OF THIS PATIENT: 55 minutes.    Amelia Jo M.D on 09/15/2018 at 11:45 PM  Between 7am to 6pm - Pager - [phone]  After 6pm go to  [email or web address] - password [email or web address] [facility]  at [facility]  [phone]  CC: Primary care physician; Patient, No Pcp Per

## 2018-09-16 ENCOUNTER — Inpatient Hospital Stay: Payer: Self-pay

## 2018-09-16 ENCOUNTER — Inpatient Hospital Stay: Payer: Medicare Other

## 2018-09-16 ENCOUNTER — Encounter: Payer: Self-pay | Admitting: Adult Health

## 2018-09-16 DIAGNOSIS — A419 Sepsis, unspecified organism: Secondary | ICD-10-CM

## 2018-09-16 DIAGNOSIS — N132 Hydronephrosis with renal and ureteral calculous obstruction: Secondary | ICD-10-CM

## 2018-09-16 LAB — COMPREHENSIVE METABOLIC PANEL
ALT: 63 U/L — ABNORMAL HIGH (ref 0–44)
AST: 90 U/L — ABNORMAL HIGH (ref 15–41)
Albumin: 1.9 g/dL — ABNORMAL LOW (ref 3.5–5.0)
Alkaline Phosphatase: 392 U/L — ABNORMAL HIGH (ref 38–126)
Anion gap: 15 (ref 5–15)
BILIRUBIN TOTAL: 2.1 mg/dL — AB (ref 0.3–1.2)
BUN: 64 mg/dL — ABNORMAL HIGH (ref 8–23)
CO2: 17 mmol/L — ABNORMAL LOW (ref 22–32)
Calcium: 7 mg/dL — ABNORMAL LOW (ref 8.9–10.3)
Chloride: 107 mmol/L (ref 98–111)
Creatinine, Ser: 5.17 mg/dL — ABNORMAL HIGH (ref 0.61–1.24)
GFR calc Af Amer: 12 mL/min — ABNORMAL LOW (ref >60)
GFR calc non Af Amer: 10 mL/min — ABNORMAL LOW (ref >60)
Glucose, Bld: 112 mg/dL — ABNORMAL HIGH (ref 70–99)
Potassium: 3 mmol/L — ABNORMAL LOW (ref 3.5–5.1)
Sodium: 139 mmol/L (ref 135–145)
TOTAL PROTEIN: 5.5 g/dL — AB (ref 6.5–8.1)

## 2018-09-16 LAB — RENAL FUNCTION PANEL
Albumin: 1.6 g/dL — ABNORMAL LOW (ref 3.5–5.0)
Albumin: 1.8 g/dL — ABNORMAL LOW (ref 3.5–5.0)
Albumin: 1.9 g/dL — ABNORMAL LOW (ref 3.5–5.0)
Albumin: 1.8 g/dL — ABNORMAL LOW (ref 3.5–5.0)
Anion gap: 13 (ref 5–15)
Anion gap: 10 (ref 5–15)
Anion gap: 10 (ref 5–15)
Anion gap: 9 (ref 5–15)
BUN: 61 mg/dL — ABNORMAL HIGH (ref 8–23)
BUN: 45 mg/dL — ABNORMAL HIGH (ref 8–23)
BUN: 42 mg/dL — ABNORMAL HIGH (ref 8–23)
BUN: 36 mg/dL — ABNORMAL HIGH (ref 8–23)
CO2: 15 mmol/L — ABNORMAL LOW (ref 22–32)
CO2: 21 mmol/L — AB (ref 22–32)
CO2: 20 mmol/L — ABNORMAL LOW (ref 22–32)
CO2: 22 mmol/L (ref 22–32)
CREATININE: 3.42 mg/dL — AB (ref 0.61–1.24)
Calcium: 6.4 mg/dL — CL (ref 8.9–10.3)
Calcium: 6.8 mg/dL — ABNORMAL LOW (ref 8.9–10.3)
Calcium: 7.2 mg/dL — ABNORMAL LOW (ref 8.9–10.3)
Calcium: 7.2 mg/dL — ABNORMAL LOW (ref 8.9–10.3)
Chloride: 108 mmol/L (ref 98–111)
Chloride: 108 mmol/L (ref 98–111)
Chloride: 109 mmol/L (ref 98–111)
Chloride: 109 mmol/L (ref 98–111)
Creatinine, Ser: 5.14 mg/dL — ABNORMAL HIGH (ref 0.61–1.24)
Creatinine, Ser: 3.14 mg/dL — ABNORMAL HIGH (ref 0.61–1.24)
Creatinine, Ser: 2.7 mg/dL — ABNORMAL HIGH (ref 0.61–1.24)
GFR calc Af Amer: 12 mL/min — ABNORMAL LOW (ref >60)
GFR calc Af Amer: 22 mL/min — ABNORMAL LOW (ref >60)
GFR calc Af Amer: 26 mL/min — ABNORMAL LOW (ref >60)
GFR calc non Af Amer: 17 mL/min — ABNORMAL LOW (ref >60)
GFR calc non Af Amer: 19 mL/min — ABNORMAL LOW (ref >60)
GFR calc non Af Amer: 22 mL/min — ABNORMAL LOW (ref >60)
GFR, EST AFRICAN AMERICAN: 19 mL/min — AB (ref >60)
GFR, EST NON AFRICAN AMERICAN: 10 mL/min — AB (ref >60)
Glucose, Bld: 152 mg/dL — ABNORMAL HIGH (ref 70–99)
Glucose, Bld: 151 mg/dL — ABNORMAL HIGH (ref 70–99)
Glucose, Bld: 163 mg/dL — ABNORMAL HIGH (ref 70–99)
Glucose, Bld: 148 mg/dL — ABNORMAL HIGH (ref 70–99)
PHOSPHORUS: 3.2 mg/dL (ref 2.5–4.6)
Phosphorus: 5.9 mg/dL — ABNORMAL HIGH (ref 2.5–4.6)
Phosphorus: 4.3 mg/dL (ref 2.5–4.6)
Phosphorus: 2.5 mg/dL (ref 2.5–4.6)
Potassium: 3.7 mmol/L (ref 3.5–5.1)
Potassium: 3.7 mmol/L (ref 3.5–5.1)
Potassium: 3.8 mmol/L (ref 3.5–5.1)
Potassium: 3.9 mmol/L (ref 3.5–5.1)
SODIUM: 140 mmol/L (ref 135–145)
Sodium: 136 mmol/L (ref 135–145)
Sodium: 139 mmol/L (ref 135–145)
Sodium: 139 mmol/L (ref 135–145)

## 2018-09-16 LAB — BLOOD CULTURE ID PANEL (REFLEXED)
Acinetobacter baumannii: NOT DETECTED
CANDIDA KRUSEI: NOT DETECTED
CANDIDA PARAPSILOSIS: NOT DETECTED
Candida albicans: NOT DETECTED
Candida glabrata: NOT DETECTED
Candida tropicalis: NOT DETECTED
Carbapenem resistance: NOT DETECTED
Enterobacter cloacae complex: NOT DETECTED
Enterobacteriaceae species: DETECTED — AB
Enterococcus species: NOT DETECTED
Escherichia coli: DETECTED — AB
Haemophilus influenzae: NOT DETECTED
Klebsiella oxytoca: NOT DETECTED
Klebsiella pneumoniae: NOT DETECTED
LISTERIA MONOCYTOGENES: NOT DETECTED
Neisseria meningitidis: NOT DETECTED
Proteus species: NOT DETECTED
Pseudomonas aeruginosa: NOT DETECTED
STREPTOCOCCUS PNEUMONIAE: NOT DETECTED
STREPTOCOCCUS PYOGENES: NOT DETECTED
Serratia marcescens: NOT DETECTED
Staphylococcus aureus (BCID): NOT DETECTED
Staphylococcus species: NOT DETECTED
Streptococcus agalactiae: NOT DETECTED
Streptococcus species: NOT DETECTED

## 2018-09-16 LAB — MAGNESIUM
Magnesium: 1.9 mg/dL (ref 1.7–2.4)
Magnesium: 1.7 mg/dL (ref 1.7–2.4)
Magnesium: 1.6 mg/dL — ABNORMAL LOW (ref 1.7–2.4)
Magnesium: 2.2 mg/dL (ref 1.7–2.4)
Magnesium: 2.2 mg/dL (ref 1.7–2.4)

## 2018-09-16 LAB — CBC
HCT: 27.4 % — ABNORMAL LOW (ref 39.0–52.0)
Hemoglobin: 9.4 g/dL — ABNORMAL LOW (ref 13.0–17.0)
MCH: 29.8 pg (ref 26.0–34.0)
MCHC: 34.3 g/dL (ref 30.0–36.0)
MCV: 87 fL (ref 80.0–100.0)
PLATELETS: 227 10*3/uL (ref 150–400)
RBC: 3.15 MIL/uL — ABNORMAL LOW (ref 4.22–5.81)
RDW: 14.5 % (ref 11.5–15.5)
WBC: 58.6 10*3/uL (ref 4.0–10.5)
nRBC: 0 % (ref 0.0–0.2)

## 2018-09-16 LAB — BASIC METABOLIC PANEL
Anion gap: 13 (ref 5–15)
BUN: 69 mg/dL — AB (ref 8–23)
CO2: 18 mmol/L — ABNORMAL LOW (ref 22–32)
Calcium: 7 mg/dL — ABNORMAL LOW (ref 8.9–10.3)
Chloride: 107 mmol/L (ref 98–111)
Creatinine, Ser: 5.35 mg/dL — ABNORMAL HIGH (ref 0.61–1.24)
GFR calc Af Amer: 11 mL/min — ABNORMAL LOW (ref >60)
GFR calc non Af Amer: 10 mL/min — ABNORMAL LOW (ref >60)
Glucose, Bld: 122 mg/dL — ABNORMAL HIGH (ref 70–99)
Potassium: 3 mmol/L — ABNORMAL LOW (ref 3.5–5.1)
Sodium: 138 mmol/L (ref 135–145)

## 2018-09-16 LAB — TROPONIN I
Troponin I: 1.33 ng/mL (ref <0.03)
Troponin I: 1.82 ng/mL (ref <0.03)
Troponin I: 1.84 ng/mL (ref <0.03)
Troponin I: 1.23 ng/mL (ref <0.03)

## 2018-09-16 LAB — PHOSPHORUS: Phosphorus: 1.6 mg/dL — ABNORMAL LOW (ref 2.5–4.6)

## 2018-09-16 LAB — VANCOMYCIN, RANDOM: Vancomycin Rm: 13

## 2018-09-16 LAB — LACTIC ACID, PLASMA
Lactic Acid, Venous: 5.8 mmol/L (ref 0.5–1.9)
Lactic Acid, Venous: 3.2 mmol/L (ref 0.5–1.9)

## 2018-09-16 LAB — GLUCOSE, CAPILLARY
Glucose-Capillary: 112 mg/dL — ABNORMAL HIGH (ref 70–99)
Glucose-Capillary: 133 mg/dL — ABNORMAL HIGH (ref 70–99)

## 2018-09-16 LAB — MRSA PCR SCREENING: MRSA by PCR: NEGATIVE

## 2018-09-16 MED ORDER — MIDAZOLAM HCL 2 MG/2ML IJ SOLN: 1.0000 mg | Freq: Once | INTRAMUSCULAR | Status: DC

## 2018-09-16 MED ORDER — PUREFLOW DIALYSIS SOLUTION
INTRAVENOUS | Status: DC
  Administered 2018-09-16 – 2018-09-17 (×3): 1 mL via INTRAVENOUS_CENTRAL

## 2018-09-16 MED ORDER — SODIUM CHLORIDE 0.9 % IV SOLN
1.0000 g | Freq: Three times a day (TID) | INTRAVENOUS | Status: DC
  Administered 2018-09-16 – 2018-09-18 (×6): 1 g via INTRAVENOUS
  Filled 2018-09-16 (×8): qty 1

## 2018-09-16 MED ORDER — PHENYLEPHRINE HCL 10 MG/ML IJ SOLN
0.0000 ug/min | INTRAVENOUS | Status: DC
  Administered 2018-09-16: 20 ug/min via INTRAVENOUS
  Filled 2018-09-16 (×2): qty 1

## 2018-09-16 MED ORDER — MIDAZOLAM HCL 2 MG/2ML IJ SOLN
INTRAMUSCULAR | Status: AC
  Administered 2018-09-16: 1 mg
  Filled 2018-09-16: qty 2

## 2018-09-16 MED ORDER — SODIUM CHLORIDE 0.9 % IV SOLN
0.0000 ug/min | INTRAVENOUS | Status: DC
  Administered 2018-09-16: 45 ug/min via INTRAVENOUS
  Filled 2018-09-16: qty 1
  Filled 2018-09-16: qty 10

## 2018-09-16 MED ORDER — SODIUM CHLORIDE 0.9 % IV BOLUS
1000.0000 mL | Freq: Once | INTRAVENOUS | Status: AC
  Administered 2018-09-16: 1000 mL via INTRAVENOUS

## 2018-09-16 MED ORDER — VANCOMYCIN HCL 10 G IV SOLR
1250.0000 mg | Freq: Once | INTRAVENOUS | Status: AC
  Administered 2018-09-16: 1250 mg via INTRAVENOUS
  Filled 2018-09-16: qty 1250

## 2018-09-16 MED ORDER — CALCIUM GLUCONATE-NACL 1-0.675 GM/50ML-% IV SOLN
1.0000 g | Freq: Once | INTRAVENOUS | Status: AC
  Administered 2018-09-16: 1000 mg via INTRAVENOUS
  Filled 2018-09-16: qty 50

## 2018-09-16 MED ORDER — FENTANYL CITRATE (PF) 100 MCG/2ML IJ SOLN
INTRAMUSCULAR | Status: AC
  Administered 2018-09-16: 25 ug
  Filled 2018-09-16: qty 2

## 2018-09-16 MED ORDER — POTASSIUM PHOSPHATES 15 MMOLE/5ML IV SOLN
45.0000 mmol | Freq: Once | INTRAVENOUS | Status: AC
  Administered 2018-09-16: 45 mmol via INTRAVENOUS
  Filled 2018-09-16: qty 15

## 2018-09-16 MED ORDER — MAGNESIUM SULFATE 2 GM/50ML IV SOLN
2.0000 g | Freq: Once | INTRAVENOUS | Status: AC
  Administered 2018-09-16: 2 g via INTRAVENOUS
  Filled 2018-09-16: qty 50

## 2018-09-16 MED ORDER — LIDOCAINE 1% INJECTION FOR CIRCUMCISION
10.0000 mL | INJECTION | Freq: Once | INTRAVENOUS | Status: DC
  Filled 2018-09-16: qty 10

## 2018-09-16 MED ORDER — HYDROCORTISONE NA SUCCINATE PF 100 MG IJ SOLR
50.0000 mg | Freq: Four times a day (QID) | INTRAMUSCULAR | Status: AC
  Administered 2018-09-16 – 2018-09-18 (×8): 50 mg via INTRAVENOUS
  Filled 2018-09-16 (×8): qty 2

## 2018-09-16 MED ORDER — HEPARIN SODIUM (PORCINE) 1000 UNIT/ML DIALYSIS
1000.0000 [IU] | INTRAMUSCULAR | Status: DC | PRN
  Administered 2018-09-17: 2800 [IU] via INTRAVENOUS_CENTRAL
  Filled 2018-09-16 (×3): qty 6

## 2018-09-16 MED ORDER — SODIUM CHLORIDE 0.9 % IV SOLN
0.0000 ug/min | INTRAVENOUS | Status: DC
  Administered 2018-09-16: 45 ug/min via INTRAVENOUS
  Filled 2018-09-16: qty 40

## 2018-09-16 MED ORDER — LIDOCAINE HCL 1 % IJ SOLN
10.0000 mL | Freq: Once | INTRAMUSCULAR | Status: DC
  Filled 2018-09-16: qty 10

## 2018-09-16 MED ORDER — POTASSIUM CHLORIDE 10 MEQ/100ML IV SOLN
10.0000 meq | INTRAVENOUS | Status: AC
  Administered 2018-09-16 (×3): 10 meq via INTRAVENOUS
  Filled 2018-09-16 (×3): qty 100

## 2018-09-16 MED ORDER — SODIUM CHLORIDE 0.9 % IV BOLUS
500.0000 mL | Freq: Once | INTRAVENOUS | Status: AC
  Administered 2018-09-16: 500 mL via INTRAVENOUS

## 2018-09-16 MED ORDER — FENTANYL CITRATE (PF) 100 MCG/2ML IJ SOLN: 25.0000 ug | Freq: Once | INTRAMUSCULAR | Status: DC

## 2018-09-16 NOTE — Progress Notes (Signed)
Spoke with Levada Dy RN re PICC order.  Pt currently has HD cath with pigtail and 3 PIV's.  RN states he is on pressors.  Notified per [facility] policy, pressors can be administered via PIV, also has the HD pigtail if necessary.  Also notified RN that PICC has to be approved by nephrology, blood cultures pending and pt is febrile, therefore recommending to hold on PICC until [facility] results x 48hrs.  RN to notify MD.

## 2018-09-16 NOTE — Consult Note (Signed)
PULMONARY / CRITICAL CARE MEDICINE  Name: Samuel Pitts MRN: [id number] DOB: Feb 22, 1946    LOS: 1  Referring Provider: Dr. Duane Boston Reason for Referral: Sepsis and septic shock Brief patient description: 72 year old male admitted with urosepsis, septic shock, bilateral hydroureteronephrosis, acute renal failure, acute encephalopathy and elevated LFTs and troponin.  HPI: This is a 72 year old Caucasian male with a medical history as indicated below who presented to the ED via EMS after being found by family lying on the couch in urine and fecal matter.  History is obtained from ED records as patient is currently confused.  Per EMS, much history could not be obtained from the family member who lives with the patient as a family member to had some form of mental disability.  When EMS arrived, patient was disheveled, and covered with urine and fecal matter.  He was transported to the ED where he was found to be tachycardic, tachypneic, confused, lethargic and had a fever of 101.7.  His ED work-up was significant for multiple electrolyte abnormalities, mildly elevated troponin, elevated lactic acid, acute renal failure with a creatinine of 5.17, elevated LFTs,  UTI and bilateral hydroureteronephrosis extends into the urinary bladder.  His chest x-ray showed a small right apical pneumothorax.  He was admitted to the ICU for further management. Upon arrival in the ICU, patient became hypotensive and also had persistent diarrhea.  His blood pressure was unresponsive to multiple fluid boluses hence he was started on pressors.  Mains encephalopathic.  He has received multiple electrolyte replacements.  Past Medical History:  Diagnosis Date  . Skin cancer    History of melanoma   Past Surgical History:  Procedure Laterality Date  . MELANOMA EXCISION     Prior to Admission medications   Medication Sig Start Date End Date Taking? Authorizing Provider  amLODipine (NORVASC) 5 MG tablet Take 5 mg by  mouth daily.   Yes [provider]  clopidogrel (PLAVIX) 75 MG tablet Take 75 mg by mouth daily.   Yes [provider]  donepezil (ARICEPT) 5 MG tablet Take 1 tablet (5 mg total) by mouth at bedtime. 06/05/18 07/15/18 Yes Sowles, Drue Stager, MD  empagliflozin (JARDIANCE) 25 MG TABS tablet Take 25 mg by mouth daily.   Yes [provider]  glycopyrrolate (ROBINUL) 1 MG tablet Take 1 mg by mouth 2 (two) times daily.   Yes [provider]  insulin aspart (NOVOLOG FLEXPEN) 100 UNIT/ML FlexPen Inject 12 Units into the skin 2 (two) times daily.   Yes [provider]  insulin aspart (NOVOLOG) 100 UNIT/ML FlexPen Inject 18 Units into the skin daily. At 1700   Yes [provider]  Insulin Degludec-Liraglutide (XULTOPHY) 100-3.6 UNIT-MG/ML SOPN Inject 50 Units into the skin daily.   Yes [provider]  levETIRAcetam (KEPPRA) 500 MG tablet Take 500 mg by mouth 2 (two) times daily.   Yes [provider]  lipase/protease/amylase (CREON) 12000 units CPEP capsule Take 6,000 Units by mouth 3 (three) times daily before meals.   Yes [provider]  lipase/protease/amylase (CREON) 12000 units CPEP capsule Take 3,000 Units by mouth at bedtime. With snack   Yes [provider]  lisinopril (PRINIVIL,ZESTRIL) 5 MG tablet Take 5 mg by mouth daily.   Yes [provider]  metoprolol succinate (TOPROL-XL) 25 MG 24 hr tablet Take 1 tablet (25 mg total) by mouth daily. 06/05/18  Yes Sowles, Drue Stager, MD  rosuvastatin (CRESTOR) 40 MG tablet Take 1 tablet (40 mg total) by mouth daily. 06/05/18  07/15/18 Yes Steele Sizer, MD  aspirin EC 81 MG tablet Take 81 mg by mouth daily.    [provider]  famotidine (PEPCID) 20 MG tablet Take 1 tablet (20 mg total) by mouth 2 (two) times daily. 06/05/18 07/05/18  Steele Sizer, MD  gabapentin (NEURONTIN) 300 MG capsule Take 1 capsule (300 mg total) by mouth 2 (two) times daily. 06/05/18  07/05/18  Steele Sizer, MD  insulin glargine (LANTUS) 100 UNIT/ML injection Inject 0.1 mLs (10 Units total) into the skin daily. 06/05/18 07/05/18  Steele Sizer, MD  lacosamide 100 MG TABS Take 1 tablet (100 mg total) by mouth 2 (two) times daily. Patient not taking: Reported on 07/15/2018 10/21/17   Fritzi Mandes, MD  promethazine (PHENERGAN) 12.5 MG tablet Take 1 tablet (12.5 mg total) by mouth every 6 (six) hours as needed for nausea or vomiting. Patient not taking: Reported on 07/15/2018 08/09/17   Stark Klein, MD  sertraline (ZOLOFT) 25 MG tablet Take 1 tablet (25 mg total) by mouth daily. Patient not taking: Reported on 07/15/2018 06/05/18   Steele Sizer, MD   Allergies Allergies  Allergen Reactions  . Omeprazole     SEVERE STOMACH PAIN    Family History History reviewed. No pertinent family history. Social History  reports that he has never smoked. He has never used smokeless tobacco. He reports that he does not drink alcohol or use drugs.  Review Of Systems: Unable to obtain as patient is currently confused and encephalopathic  VITAL SIGNS: BP (!) 76/50 (BP Location: Left Arm)   Pulse (!) 101   Temp 98.4 F (36.9 C)   Resp (!) 21   Ht 6' (1.829 m)   Wt 74.8 kg   SpO2 98%   BMI 22.38 kg/m   HEMODYNAMICS:    VENTILATOR SETTINGS:    INTAKE / OUTPUT: No intake/output data recorded.  PHYSICAL EXAMINATION: General: Cachectic and disheveled HEENT: Sclera icteric, pupils equal and reactive, extraocular eye movements intact, trachea midline Neuro: Alert and oriented to person only, moves all extremities, follows some basic commands Cardiovascular: Apical pulse tachycardic, regular, S1-S2, no murmur regurg or gallop, +2 pulses bilaterally, no edema Lungs: Lateral breath sounds without any wheezes or rhonchi, diminished in the bases Abdomen: Nondistended, normal bowel sounds, palpation reveals diffuse tenderness Musculoskeletal: No joint deformities, positive range of  motion Skin: Warm and dry, poor turgor, mild jaundice LABS:  BMET Recent Labs  Lab 09/15/18 2129 09/16/18 0055 09/16/18 0507  NA 136 139 138  K 2.9* 3.0* 3.0*  CL 101 107 107  CO2 17* 17* 18*  BUN 67* 64* 69*  CREATININE 5.35* 5.17* 5.35*  GLUCOSE 113* 112* 122*    Electrolytes Recent Labs  Lab 09/15/18 2129 09/16/18 0055 09/16/18 0507  CALCIUM 7.7* 7.0* 7.0*  MG  --  1.9  --   PHOS  --  1.6*  --     CBC Recent Labs  Lab 09/15/18 2129 09/16/18 0507  WBC 10.5 PENDING  HGB 12.0* 9.4*  HCT 36.3* 27.4*  PLT 248 227    Coag's Recent Labs  Lab 09/15/18 2129  INR 1.42    Sepsis Markers Recent Labs  Lab 09/15/18 2123 09/16/18 0053  LATICACIDVEN 6.19* 5.8*    ABG No results for input(s): PHART, PCO2ART, PO2ART in the last 168 hours.  Liver Enzymes Recent Labs  Lab 09/15/18 2129 09/16/18 0055  AST 84* 90*  ALT 66* 63*  ALKPHOS 488* 392*  BILITOT 2.1* 2.1*  ALBUMIN 2.3* 1.9*  Cardiac Enzymes Recent Labs  Lab 09/15/18 2129 09/16/18 0055  TROPONINI 0.18* 1.33*    Glucose Recent Labs  Lab 09/16/18 0018  GLUCAP 112*    Imaging Ct Chest Wo Contrast  Result Date: 09/15/2018 CLINICAL DATA:  Weakness for the past week with fever. Patient has apparently been laying in his own urine and feces for on certain length of time. EXAM: CT CHEST, ABDOMEN AND PELVIS WITHOUT CONTRAST TECHNIQUE: Multidetector CT imaging of the chest, abdomen and pelvis was performed following the standard protocol without IV contrast. COMPARISON:  None. FINDINGS: CT CHEST FINDINGS Cardiovascular: Conventional branch pattern of the great vessels. Atherosclerosis is noted at the origin of the left subclavian artery. 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 Normal heart size without pericardial effusion. Left main and three-vessel coronary arteriosclerosis predominantly along the left main and LAD. The unenhanced pulmonary vasculature is unremarkable.  Mediastinum/Nodes: There is mediastinal shift to the right from volume loss. No lymphadenopathy. Trachea and mainstem bronchi appear patent. The esophagus is unremarkable. The thyroid gland is not enlarged. 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 No pneumothorax. Subpleural areas of scarring at the lung bases. No dominant mass. Minimal scarring at the left lung apex. Musculoskeletal: Remote right-sided rib fractures. No acute nor suspicious osseous lesions. CT ABDOMEN PELVIS FINDINGS Hepatobiliary: No focal liver abnormality is seen. No gallstones, gallbladder wall thickening, or biliary dilatation. Pancreas: Unremarkable. No pancreatic ductal dilatation or surrounding inflammatory changes. Spleen: Normal in size without focal abnormality. Adrenals/Urinary Tract: Normal bilateral adrenal glands. Marked bilateral hydroureteronephrosis extending to the urinary bladder where it is circumferentially thick-walled in appearance up to 12 mm. The urinary bladder is decompressed by an indwelling Foley catheter. A discrete mass or calculus is not identified however. Stomach/Bowel: The stomach is physiologically distended. Normal small bowel rotation without obstruction or inflammation. Distal and terminal ileum are unremarkable. Normal appearing appendix is visualized. No bowel obstruction or inflammation. Vascular/Lymphatic: Aortoiliac atherosclerosis. No aneurysm. No adenopathy. Reproductive: Within normal limits appearance of the prostate gland. Seminal vesicles are unremarkable. Other: Fat containing inguinal hernia on the left. No abdominopelvic ascites. Musculoskeletal: No aggressive osseous lesions. 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 IMPRESSION: 1. 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 2. Ectasia of the  ascending thoracic aorta to 3.7 cm. Recommend annual imaging followup by CTA or MRA. This recommendation follows 2010 ACCF/AHA/AATS/ACR/ASA/SCA/SCAI/SIR/STS/SVM Guidelines for the Diagnosis and Management of Patients with Thoracic Aortic Disease. 2010; 121: B284-X324. 3. Marked bilateral hydroureteronephrosis extending to the urinary bladder where it is circumferentially thick-walled up to 12 mm. The urinary bladder is decompressed by indwelling Foley catheter. A discrete mass or calculus is not identified however. Findings may be due to mural thickening possibly from chronic cystitis. 4. Fat containing left inguinal hernia. 5. 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 Aortic Atherosclerosis (ICD10-I70.0). Electronically Signed   By: Ashley Royalty M.D.   On: 09/15/2018 23:20   Dg Chest Port 1 View  Result Date: 09/15/2018 CLINICAL DATA:  Fever. EXAM: PORTABLE CHEST 1 VIEW COMPARISON:  None. FINDINGS: Cardiomediastinal silhouette is normal. Trachea shifted to the RIGHT with RIGHT apical pleural thickening. Bilateral calcified pleural plaques and RIGHT lung pleural thickening. No pleural effusion or focal consolidation. Small potential pneumothorax. Soft tissue planes and included osseous structures are non suspicious. IMPRESSION: 1. Small potential RIGHT pneumothorax. Calcified pleural plaques which may obscure  underlying acute process. RIGHT apical pleural thickening and scarring. 2. Acute findings discussed with and reconfirmed by Dr.JAMES MCSHANE on 09/15/2018 at 10:19 pm. Electronically Signed   By: Elon Alas M.D.   On: 09/15/2018 22:22   Ct Renal Stone Study  Result Date: 09/15/2018 CLINICAL DATA:  Weakness for the past week with fever. Patient has apparently been laying in his own urine and feces for on certain length of time. EXAM: CT CHEST, ABDOMEN AND PELVIS WITHOUT CONTRAST TECHNIQUE: Multidetector CT imaging of the chest, abdomen and pelvis was performed following the standard protocol  without IV contrast. COMPARISON:  None. FINDINGS: CT CHEST FINDINGS Cardiovascular: Conventional branch pattern of the great vessels. Atherosclerosis is noted at the origin of the left subclavian artery. 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 Normal heart size without pericardial effusion. Left main and three-vessel coronary arteriosclerosis predominantly along the left main and LAD. The unenhanced pulmonary vasculature is unremarkable. Mediastinum/Nodes: There is mediastinal shift to the right from volume loss. No lymphadenopathy. Trachea and mainstem bronchi appear patent. The esophagus is unremarkable. The thyroid gland is not enlarged. 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 No pneumothorax. Subpleural areas of scarring at the lung bases. No dominant mass. Minimal scarring at the left lung apex. Musculoskeletal: Remote right-sided rib fractures. No acute nor suspicious osseous lesions. CT ABDOMEN PELVIS FINDINGS Hepatobiliary: No focal liver abnormality is seen. No gallstones, gallbladder wall thickening, or biliary dilatation. Pancreas: Unremarkable. No pancreatic ductal dilatation or surrounding inflammatory changes. Spleen: Normal in size without focal abnormality. Adrenals/Urinary Tract: Normal bilateral adrenal glands. Marked bilateral hydroureteronephrosis extending to the urinary bladder where it is circumferentially thick-walled in appearance up to 12 mm. The urinary bladder is decompressed by an indwelling Foley catheter. A discrete mass or calculus is not identified however. Stomach/Bowel: The stomach is physiologically distended. Normal small bowel rotation without obstruction or inflammation. Distal and terminal ileum are unremarkable. Normal appearing appendix is visualized. No bowel obstruction or inflammation. Vascular/Lymphatic: Aortoiliac atherosclerosis. No  aneurysm. No adenopathy. Reproductive: Within normal limits appearance of the prostate gland. Seminal vesicles are unremarkable. Other: Fat containing inguinal hernia on the left. No abdominopelvic ascites. Musculoskeletal: No aggressive osseous lesions. 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 IMPRESSION: 1. 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 2. 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 Recommend annual imaging followup by CTA or MRA. This recommendation follows 2010 ACCF/AHA/AATS/ACR/ASA/SCA/SCAI/SIR/STS/SVM Guidelines for the Diagnosis and Management of Patients with Thoracic Aortic Disease. 2010; 121: W466-Z993. 3. Marked bilateral hydroureteronephrosis extending to the urinary bladder where it is circumferentially thick-walled up to 12 mm. The urinary bladder is decompressed by indwelling Foley catheter. A discrete mass or calculus is not identified however. Findings may be due to mural thickening possibly from chronic cystitis. 4. Fat containing left inguinal hernia. 5. 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 Aortic Atherosclerosis (ICD10-I70.0). Electronically Signed   By: Ashley Royalty M.D.   On: 09/15/2018 23:20   [place] Ekg Site Rite  Result Date: 09/16/2018 If Site Rite image not attached, placement could not be confirmed due to current cardiac rhythm.   STUDIES:  Renal ultrasound  CULTURES: Blood cultures x2 Urine culture  ANTIBIOTICS: Vancomycin Cefepime  SIGNIFICANT EVENTS: 09/15/2018: Admitted  LINES/TUBES: Peripheral IVs Foley catheter  DISCUSSION: 72 year old male presenting with bilateral hydroureteronephrosis, septic shock from urosepsis and acute renal  failure secondary to a combination of volume depletion and hydronephrosis  ASSESSMENT  Septic shock Urosepsis Bilateral hydroureteronephrosis Acute renal failure-creatinine trending up, now 5.35 up from  5.17 Hypokalemia Hypophosphatemia Elevated troponin Lactic acidosis Acute metabolic encephalopathy due to sepsis Elevated LFTs Elevated troponin- demand ischemia due to sepsis versus ACS Diarrhea- unclear etiology, stool for C. difficile pending  PLAN More dynamic monitoring per ICU protocol IV hydration and pressors to maintain mean arterial blood pressure greater than 69 Start urology consult-spoke with [place] Monitor I's and O's Antibiotics as above Trend creatinine Nephrology consulted, awaiting input Monitor and replace electrolytes Trend troponin and consider cardiology consult if troponins continue to trend up Trend LFTs Trend CK to rule out rhabdomyolysis  Changes in treatment plan pending clinical course and diagnostics  Best Practice: Code Status: Full code Diet: Regular GI prophylaxis: Not indicated VTE prophylaxis: Subcu heparin  FAMILY  - Updates: Family at bedside.  Will update when available  Magdalene S. [facility] ANP-BC Pulmonary and Critical Care Medicine [facility] Pager [phone] or [phone]  NB: This document was prepared using Dragon voice recognition software and may include unintentional dictation errors.    09/16/2018, 6:11 AM

## 2018-09-16 NOTE — Progress Notes (Signed)
PHARMACY - PHYSICIAN COMMUNICATION CRITICAL VALUE ALERT - BLOOD CULTURE IDENTIFICATION (BCID)  Samuel Pitts is an 72 y.o. male who presented to [facility] on 09/15/2018 with a chief complaint of found down  Assessment:  Suspected source of bacteremia is urine  Name of physician (or Provider) Contacted: [place]  Current antibiotics: vanc/cefepime/flagyl  Changes to prescribed antibiotics recommended:  Recommendations accepted by provider  Will d/c vanc/cefepime/flagyl and start meropenem Pt is currently on CRRT- will give meropenem 1g q 8 hr  Results for orders placed or performed during the hospital encounter of 09/15/18  Blood Culture ID Panel (Reflexed) (Collected: 09/15/2018  9:29 PM)  Result Value Ref Range   Enterococcus species NOT DETECTED NOT DETECTED   Listeria monocytogenes NOT DETECTED NOT DETECTED   Staphylococcus species NOT DETECTED NOT DETECTED   Staphylococcus aureus (BCID) NOT DETECTED NOT DETECTED   Streptococcus species NOT DETECTED NOT DETECTED   Streptococcus agalactiae NOT DETECTED NOT DETECTED   Streptococcus pneumoniae NOT DETECTED NOT DETECTED   Streptococcus pyogenes NOT DETECTED NOT DETECTED   Acinetobacter baumannii NOT DETECTED NOT DETECTED   Enterobacteriaceae species DETECTED (A) NOT DETECTED   Enterobacter cloacae complex NOT DETECTED NOT DETECTED   Escherichia coli DETECTED (A) NOT DETECTED   Klebsiella oxytoca NOT DETECTED NOT DETECTED   Klebsiella pneumoniae NOT DETECTED NOT DETECTED   Proteus species NOT DETECTED NOT DETECTED   Serratia marcescens NOT DETECTED NOT DETECTED   Carbapenem resistance NOT DETECTED NOT DETECTED   Haemophilus influenzae NOT DETECTED NOT DETECTED   Neisseria meningitidis NOT DETECTED NOT DETECTED   Pseudomonas aeruginosa NOT DETECTED NOT DETECTED   Candida albicans NOT DETECTED NOT DETECTED   Candida glabrata NOT DETECTED NOT DETECTED   Candida krusei NOT DETECTED NOT DETECTED   Candida parapsilosis NOT  DETECTED NOT DETECTED   Candida tropicalis NOT DETECTED NOT DETECTED    Melissa D Maccia 09/16/2018  11:13 AM

## 2018-09-16 NOTE — Progress Notes (Addendum)
Temporary Dialysis catheter placed by Dr Jefferson Fuel.  CRRT started.

## 2018-09-16 NOTE — Consult Note (Signed)
Pharmacy consulted to dose adjust for CRRT  Meropenem dose ordered with adjustment for CRRT No other adjustments needed at this time Pharmacy will continue to monitor daily for needed changes  Ramond Dial, Pharm.D, BCPS Clinical Pharmacist

## 2018-09-16 NOTE — Progress Notes (Signed)
Pharmacy Electrolyte Monitoring Consult:  Pharmacy consulted to assist in monitoring and replacing electrolytes in this 72 y.o. male admitted on 09/15/2018 with Weakness   Labs:  Sodium (mmol/L)  Date Value  09/16/2018 136   Potassium (mmol/L)  Date Value  09/16/2018 3.7   Magnesium (mg/dL)  Date Value  09/16/2018 1.7   Phosphorus (mg/dL)  Date Value  09/16/2018 5.9 (H)   Calcium (mg/dL)  Date Value  09/16/2018 6.4 (LL)   Albumin (g/dL)  Date Value  09/16/2018 1.6 (L)   12/07 @ 0100 K 3.0, phos 1.6, mag 1.9, Cca 8.6. Will replace w/ Kphos 45 mmol IV x 1 and KCI 10 mEq IV x 3 and will recheck electrolytes @ 1200. Mag WNL.  Assessment/Plan: 12/7 at 1120: K 3.7, Mag 1.7, phos 5.9 Pt starting on CRRT today  Will recheck labs in AM  Rayna Sexton, PharmD, BCPS Clinical Pharmacist 09/16/2018 1:22 PM

## 2018-09-16 NOTE — Progress Notes (Signed)
Attempted to call RN re PICC line, placed on hold, with no answer from RN.  Pt with Cr Clearance of 12, nephrology consulted, will need approval from nephrology for PICC placement.  Also, pt Code Sepsis with temp 101.7 around 2300, blood cultures pending and pt hypotensive.  Recommend placing PICC in 48 hrs once blood cultures are negative.  Current per chart, pt has 3 PIV's, one of which is not infusing.  Will attempt to call back later.

## 2018-09-16 NOTE — Progress Notes (Signed)
Pharmacy Antibiotic Note  Samuel Pitts is a 72 y.o. male admitted on 09/15/2018 with sepsis s/t UTI.  Pharmacy has been consulted for vanc/cefepime dosing. Patient admitted w/ weakness and meeting 3/4 SIRS + qSOFA 3/3 (systolic < 023, RR > 22, AMS) and LA of 6.19 and is in AKI (baseline Scr 0.93 from [facility] -- here it is 5.35). Patient received vanc 1g, cefepime 2g, and flagyl 500 mg IV x 1  Plan: 12/07 @ 0500 VR 13 mcg/ml will give vanc 1.25g IV x 1 and check a random w/ am labs. Renal function bad stable.  Height: 6' (182.9 cm) Weight: 165 lb (74.8 kg) IBW/kg (Calculated) : 77.6  Temp (24hrs), Avg:100 F (37.8 C), Min:98.1 F (36.7 C), Max:104.8 F (40.4 C)  Recent Labs  Lab 09/15/18 2123 09/15/18 2129 09/16/18 0053 09/16/18 0055 09/16/18 0507 09/16/18 0512  WBC  --  10.5  --   --  58.6*  --   CREATININE  --  5.35*  --  5.17* 5.35*  --   LATICACIDVEN 6.19*  --  5.8*  --  3.2*  --   VANCORANDOM  --   --   --   --   --  13    Estimated Creatinine Clearance: 13 mL/min (A) (by C-G formula based on SCr of 5.35 mg/dL (H)).    Allergies  Allergen Reactions  . Omeprazole     SEVERE STOMACH PAIN    Thank you for allowing pharmacy to be a part of this patient's care.  Tobie Lords, PharmD, BCPS Clinical Pharmacist 09/16/2018

## 2018-09-16 NOTE — Progress Notes (Signed)
Patient ID: Samuel Pitts, male   DOB: 02/23/1946, 72 y.o.   MRN: [id number] Pulmonary/critical care attending  Procedure note Dialysis catheter Indication: Acute renal failure with hydronephrosis Consent is obtained Performed under ultrasound guidance Complete contact barrier precaution utilized Subdermal lidocaine injected With ultrasound guidance the right femoral vein was cannulated without difficulty Guidewire was placed and the needle was removed Using the Seldinger dilation technique a Trialysis catheter was placed without difficulty The guidewire was removed intact All 3 ports flushed with dark nonpulsatile venous return Sutured into place Dressed by nurse  Hermelinda Dellen, DO

## 2018-09-16 NOTE — Progress Notes (Signed)
Pharmacy Electrolyte Monitoring Consult:  Pharmacy consulted to assist in monitoring and replacing electrolytes in this 72 y.o. male admitted on 09/15/2018 with Weakness   Labs:  Sodium (mmol/L)  Date Value  09/16/2018 139   Potassium (mmol/L)  Date Value  09/16/2018 3.0 (L)   Magnesium (mg/dL)  Date Value  09/16/2018 1.9   Phosphorus (mg/dL)  Date Value  09/16/2018 1.6 (L)   Calcium (mg/dL)  Date Value  09/16/2018 7.0 (L)   Albumin (g/dL)  Date Value  09/16/2018 1.9 (L)    Assessment/Plan: 12/07 @ 0100 K 3.0, phos 1.6, mag 1.9, Cca 8.6. Will replace w/ Kphos 45 mmol IV x 1 and KCI 10 mEq IV x 3 and will recheck electrolytes @ 1200. Mag WNL.  Tobie Lords, PharmD, BCPS Clinical Pharmacist 09/16/2018

## 2018-09-16 NOTE — Progress Notes (Signed)
[place] [facility] [place] NAME: Samuel Pitts    MR#:  [id number]  DATE OF BIRTH:  20-Apr-1946  SUBJECTIVE:  Patient sleepy drowsy this am  REVIEW OF SYSTEMS:    Able to obtain   Tolerating Diet: npo      DRUG ALLERGIES:   Allergies  Allergen Reactions  . Omeprazole     SEVERE STOMACH PAIN    VITALS:  Blood pressure 96/73, pulse 82, temperature (!) 96.1 F (35.6 C), resp. rate (!) 21, height 6' (1.829 m), weight 74.8 kg, SpO2 100 %.  PHYSICAL EXAMINATION:  Constitutional: Appears well-developed and well-nourished. No distress. HENT: Normocephalic. Marland Kitchen Oropharynx is clear and moist.  Eyes: Conjunctivae are normal. PERRLA, no scleral icterus.  Neck: No JVD. No tracheal deviation. CVS: RRR, S1/S2 +, no murmurs, no gallops, no carotid bruit.  Pulmonary: Effort and breath sounds normal, no stridor, rhonchi, wheezes, rales.  Abdominal: Soft. BS +,  no distension, tenderness, rebound or guarding.  Musculoskeletal: Normal range of motion. No edema and no tenderness.  Neuro: Sleepy and drowsy  psychiatric: Lethargic     LABORATORY PANEL:   CBC Recent Labs  Lab 09/16/18 0507  WBC 58.6*  HGB 9.4*  HCT 27.4*  PLT 227   ------------------------------------------------------------------------------------------------------------------  Chemistries  Recent Labs  Lab 09/16/18 0055  09/16/18 1120  NA 139   < > 136  K 3.0*   < > 3.7  CL 107   < > 108  CO2 17*   < > 15*  GLUCOSE 112*   < > 152*  BUN 64*   < > 61*  CREATININE 5.17*   < > 5.14*  CALCIUM 7.0*   < > 6.4*  MG 1.9  --  1.7  AST 90*  --   --   ALT 63*  --   --   ALKPHOS 392*  --   --   BILITOT 2.1*  --   --    < > = values in this interval not displayed.   ------------------------------------------------------------------------------------------------------------------  Cardiac Enzymes Recent Labs  Lab 09/16/18 0055 09/16/18 0507 09/16/18 1120   TROPONINI 1.33* 1.82* 1.84*   ------------------------------------------------------------------------------------------------------------------  RADIOLOGY:  Ct Chest Wo Contrast  Result Date: 09/15/2018 CLINICAL DATA:  Weakness for the past week with fever. Patient has apparently been laying in his own urine and feces for on certain length of time. EXAM: CT CHEST, ABDOMEN AND PELVIS WITHOUT CONTRAST TECHNIQUE: Multidetector CT imaging of the chest, abdomen and pelvis was performed following the standard protocol without IV contrast. COMPARISON:  None. FINDINGS: CT CHEST FINDINGS Cardiovascular: Conventional branch pattern of the great vessels. Atherosclerosis is noted at the origin of the left subclavian artery. 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 Normal heart size without pericardial effusion. Left main and three-vessel coronary arteriosclerosis predominantly along the left main and LAD. The unenhanced pulmonary vasculature is unremarkable. Mediastinum/Nodes: There is mediastinal shift to the right from volume loss. No lymphadenopathy. Trachea and mainstem bronchi appear patent. The esophagus is unremarkable. The thyroid gland is not enlarged. 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 No pneumothorax. Subpleural areas of scarring at the lung bases. No dominant mass. Minimal scarring at the left lung apex. Musculoskeletal: Remote right-sided rib fractures. No acute nor suspicious osseous lesions. CT ABDOMEN PELVIS FINDINGS Hepatobiliary: No focal liver abnormality is seen. No gallstones, gallbladder wall thickening, or biliary  dilatation. Pancreas: Unremarkable. No pancreatic ductal dilatation or surrounding inflammatory changes. Spleen: Normal in size without focal abnormality. Adrenals/Urinary Tract: Normal bilateral adrenal glands. Marked bilateral hydroureteronephrosis  extending to the urinary bladder where it is circumferentially thick-walled in appearance up to 12 mm. The urinary bladder is decompressed by an indwelling Foley catheter. A discrete mass or calculus is not identified however. Stomach/Bowel: The stomach is physiologically distended. Normal small bowel rotation without obstruction or inflammation. Distal and terminal ileum are unremarkable. Normal appearing appendix is visualized. No bowel obstruction or inflammation. Vascular/Lymphatic: Aortoiliac atherosclerosis. No aneurysm. No adenopathy. Reproductive: Within normal limits appearance of the prostate gland. Seminal vesicles are unremarkable. Other: Fat containing inguinal hernia on the left. No abdominopelvic ascites. Musculoskeletal: No aggressive osseous lesions. 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 IMPRESSION: 1. 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 2. 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 Recommend annual imaging followup by CTA or MRA. This recommendation follows 2010 ACCF/AHA/AATS/ACR/ASA/SCA/SCAI/SIR/STS/SVM Guidelines for the Diagnosis and Management of Patients with Thoracic Aortic Disease. 2010; 121: I948-N462. 3. Marked bilateral hydroureteronephrosis extending to the urinary bladder where it is circumferentially thick-walled up to 12 mm. The urinary bladder is decompressed by indwelling Foley catheter. A discrete mass or calculus is not identified however. Findings may be due to mural thickening possibly from chronic cystitis. 4. Fat containing left inguinal hernia. 5. 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 Aortic Atherosclerosis (ICD10-I70.0). Electronically Signed   By: Ashley Royalty M.D.   On: 09/15/2018 23:20   US Renal  Result Date: 09/16/2018 CLINICAL DATA:  Renal failure EXAM: RENAL / URINARY TRACT ULTRASOUND COMPLETE COMPARISON:  CT abdomen 09/15/2018 FINDINGS: Right Kidney: Renal  measurements: 30.4 x 6.3 x 5 cm = volume: 223 mL. Mild increased renal cortical echogenicity. No renal mass. Mild dilatation of right renal pelvis without significant calyceal dilatation. Left Kidney: Renal measurements: 12.4 x 5.7 x 5.6 cm = volume: 212 mL. Mild increased renal cortical echogenicity. No renal mass. Moderate dilatation of right renal pelvis without significant calyceal dilatation. Bladder: Irregular thickened bladder wall with a Foley catheter present. Prevoid bladder volume 51.8 mL. IMPRESSION: 1. Mild right and moderate left renal pelvis dilatation without significant calyceal dilatation. 2. Mild increased renal cortical echogenicity bilaterally as can be seen with medical renal disease versus intrinsic bladder wall abnormality. 3. Severely thickened bladder wall as can be seen with bladder outlet obstruction. Electronically Signed   By: Kathreen Devoid   On: 09/16/2018 08:57   Dg Chest Port 1 View  Result Date: 09/15/2018 CLINICAL DATA:  Fever. EXAM: PORTABLE CHEST 1 VIEW COMPARISON:  None. FINDINGS: Cardiomediastinal silhouette is normal. Trachea shifted to the RIGHT with RIGHT apical pleural thickening. Bilateral calcified pleural plaques and RIGHT lung pleural thickening. No pleural effusion or focal consolidation. Small potential pneumothorax. Soft tissue planes and included osseous structures are non suspicious. IMPRESSION: 1. Small potential RIGHT pneumothorax. Calcified pleural plaques which may obscure underlying acute process. RIGHT apical pleural thickening and scarring. 2. Acute findings discussed with and reconfirmed by Dr.JAMES MCSHANE on 09/15/2018 at 10:19 pm. Electronically Signed   By: Elon Alas M.D.   On: 09/15/2018 22:22   Ct Renal Stone Study  Result Date: 09/15/2018 CLINICAL DATA:  Weakness for the past week with fever. Patient has apparently been laying in his own urine and feces for on certain length of time. EXAM: CT CHEST, ABDOMEN AND PELVIS WITHOUT CONTRAST  TECHNIQUE: Multidetector CT  imaging of the chest, abdomen and pelvis was performed following the standard protocol without IV contrast. COMPARISON:  None. FINDINGS: CT CHEST FINDINGS Cardiovascular: Conventional branch pattern of the great vessels. Atherosclerosis is noted at the origin of the left subclavian artery. 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 Normal heart size without pericardial effusion. Left main and three-vessel coronary arteriosclerosis predominantly along the left main and LAD. The unenhanced pulmonary vasculature is unremarkable. Mediastinum/Nodes: There is mediastinal shift to the right from volume loss. No lymphadenopathy. Trachea and mainstem bronchi appear patent. The esophagus is unremarkable. The thyroid gland is not enlarged. 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 No pneumothorax. Subpleural areas of scarring at the lung bases. No dominant mass. Minimal scarring at the left lung apex. Musculoskeletal: Remote right-sided rib fractures. No acute nor suspicious osseous lesions. CT ABDOMEN PELVIS FINDINGS Hepatobiliary: No focal liver abnormality is seen. No gallstones, gallbladder wall thickening, or biliary dilatation. Pancreas: Unremarkable. No pancreatic ductal dilatation or surrounding inflammatory changes. Spleen: Normal in size without focal abnormality. Adrenals/Urinary Tract: Normal bilateral adrenal glands. Marked bilateral hydroureteronephrosis extending to the urinary bladder where it is circumferentially thick-walled in appearance up to 12 mm. The urinary bladder is decompressed by an indwelling Foley catheter. A discrete mass or calculus is not identified however. Stomach/Bowel: The stomach is physiologically distended. Normal small bowel rotation without obstruction or inflammation. Distal and terminal ileum are unremarkable. Normal appearing  appendix is visualized. No bowel obstruction or inflammation. Vascular/Lymphatic: Aortoiliac atherosclerosis. No aneurysm. No adenopathy. Reproductive: Within normal limits appearance of the prostate gland. Seminal vesicles are unremarkable. Other: Fat containing inguinal hernia on the left. No abdominopelvic ascites. Musculoskeletal: No aggressive osseous lesions. 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 IMPRESSION: 1. 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 2. 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 Recommend annual imaging followup by CTA or MRA. This recommendation follows 2010 ACCF/AHA/AATS/ACR/ASA/SCA/SCAI/SIR/STS/SVM Guidelines for the Diagnosis and Management of Patients with Thoracic Aortic Disease. 2010; 121: E527-P824. 3. Marked bilateral hydroureteronephrosis extending to the urinary bladder where it is circumferentially thick-walled up to 12 mm. The urinary bladder is decompressed by indwelling Foley catheter. A discrete mass or calculus is not identified however. Findings may be due to mural thickening possibly from chronic cystitis. 4. Fat containing left inguinal hernia. 5. 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 Aortic Atherosclerosis (ICD10-I70.0). Electronically Signed   By: Ashley Royalty M.D.   On: 09/15/2018 23:20   [place] Ekg Site Rite  Result Date: 09/16/2018 If Site Rite image not attached, placement could not be confirmed due to current cardiac rhythm.    ASSESSMENT AND PLAN:   72 year old male with a history of skin cancer who presented to the emergency room due to generalized weakness.  1.  Septic shock from UTI with bilateral hydro-ureter nephrosis: Patient presents with fever, hypotensive tachycardia and leukocytosis. Continue pressors to keep MAP>65 Continue broad-spectrum antibiotics for now and follow-up on final cultures  2.  Bilateral hydroureter nephrosis: Patient  evaluated by urology. Maintain Foley catheter for postobstructive diuresis He will need a Foley 10 to 14 days and outpatient voiding trial  Follow-up on culture  3.  Acute renal failure due to hydronephrosis: Patient evaluated by nephrology.  Recommendation for renal replacement therapy in the form of CRRT  4.  Hypokalemia: Resolved  5.  Elevated troponin in the  setting of poor renal clearance and demand ischemia with septic shock  6.  Acute metabolic encephalopathy due to septic shock and acute renal failure  7.  Elevated liver function tests in the setting of septic shock Continue to monitor  Patient critically ill.  High risk for cardiopulmonary arrestCODE STATUS: full  TOTAL TIME TAKING CARE OF THIS PATIENT: 30 minutes.     POSSIBLE D/C ??, DEPENDING ON CLINICAL CONDITION.   Alicja Everitt M.D on 09/16/2018 at 1:04 PM  Between 7am to 6pm - Pager - [phone] After 6pm go to [email or web address] - password [email or web address] [facility]  Office  [phone]  CC: Primary care physician; Patient, No Pcp Per  Note: This dictation was prepared with Dragon dictation along with smaller phrase technology. Any transcriptional errors that result from this process are unintentional.

## 2018-09-16 NOTE — Progress Notes (Signed)
[facility] CONSULT NOTE    Date: 09/16/2018                  [name]  MRN: [id number]  DOB: [birth year]  Age / Sex: 72 y.o., male         PCP: Patient, No Pcp Per                 Service Requesting Consult: Critical care                 Reason for Consult: Acute renal failure in setting of bilateral hydronephrosis            History of Present Illness: Patient is a 72 y.o. male with a PMHx of skin cancer, who was admitted to [facility] on 09/15/2018 for evaluation of generalized weakness.  He also had altered mental status upon admission however this appears to have slightly improved.  However patient overall does appear to be a poor historian.  Apparently he is been having some difficulty with urination for quite some time however patient cannot quantify exactly.  On imaging he was found to have bilateral hydronephrosis.  He has been seen by urology today.  He had labs performed on April 07, 2018 which indicated a creatinine of 0.93 and EGFR of 82.  However creatinine now is quite high at 5.35.  Lactic acid is also elevated at 3.2.  WBC count is quite elevated at 58.6.  He is being administered broad-spectrum antibiotics.  Foley catheter has been placed.  There is no immediate plan for nephrostomy placement however this may be a possibility.  Case discussed with critical care and we are currently planning continuous renal placement therapy.  Patient is agreeable to this at the moment.   Medications: Outpatient medications: Medications Prior to Admission  Medication Sig Dispense Refill Last Dose  . amoxicillin (AMOXIL) 500 MG capsule Take 1,000 mg by mouth 2 (two) times daily.   Not Taking at Unknown time  . clarithromycin (BIAXIN) 500 MG tablet Take 500 mg by mouth 2 (two) times daily. 38 DAYS   Not Taking at Unknown time  . metroNIDAZOLE (FLAGYL) 500 MG tablet Take 500 mg by mouth 2 (two) times daily.   Not Taking at Unknown time  . omeprazole  (PRILOSEC) 40 MG capsule Take 40 mg by mouth daily.   Not Taking at Unknown time    Current medications: Current Facility-Administered Medications  Medication Dose Route Frequency Provider Last Rate Last Dose  . fentaNYL (SUBLIMAZE) 100 MCG/2ML injection           . midazolam (VERSED) 2 MG/2ML injection           . 0.9 %  sodium chloride infusion   Intravenous Continuous Tukov-Yual, Magdalene S, NP 100 mL/hr at 09/16/18 0829    . acetaminophen (TYLENOL) tablet 650 mg  650 mg Oral Q6H PRN Amelia Jo, MD   650 mg at 09/16/18 6659   Or  . acetaminophen (TYLENOL) suppository 650 mg  650 mg Rectal Q6H PRN Amelia Jo, MD      . bisacodyl (DULCOLAX) EC tablet 5 mg  5 mg Oral Daily PRN Amelia Jo, MD      . ceFEPIme (MAXIPIME) 1 g in sodium chloride 0.9 % 100 mL IVPB  1 g Intravenous Q24H Amelia Jo, MD      . docusate sodium (COLACE) capsule 100 mg  100 mg Oral BID Amelia Jo, MD      .  fentaNYL (SUBLIMAZE) injection 25 mcg  25 mcg Intravenous Once Conforti, John, DO      . heparin injection 5,000 Units  5,000 Units Subcutaneous Q8H Amelia Jo, MD   5,000 Units at 09/16/18 0055  . HYDROcodone-acetaminophen (NORCO/VICODIN) 5-325 MG per tablet 1-2 tablet  1-2 tablet Oral Q4H PRN Amelia Jo, MD      . hydrocortisone sodium succinate (SOLU-CORTEF) 100 MG injection 50 mg  50 mg Intravenous Q6H Tukov-Yual, Magdalene S, NP   50 mg at 09/16/18 0922  . lidocaine (XYLOCAINE) 1 % (with pres) injection 10 mL  10 mL Infiltration Once Ramond Dial, RPH      . metroNIDAZOLE (FLAGYL) IVPB 500 mg  500 mg Intravenous Q8H Amelia Jo, MD   Stopped at 09/16/18 [id number]  . midazolam (VERSED) injection 1 mg  1 mg Intravenous Once Conforti, John, DO      . ondansetron (ZOFRAN) tablet 4 mg  4 mg Oral Q6H PRN Amelia Jo, MD       Or  . ondansetron [facility]) injection 4 mg  4 mg Intravenous Q6H PRN Amelia Jo, MD      . phenylephrine (NEO-SYNEPHRINE) 10 mg in sodium chloride 0.9 % 250 mL (0.04  mg/mL) infusion  0-400 mcg/min Intravenous Titrated Marcelle Overlie D, RPH 67.5 mL/hr at 09/16/18 1017 45 mcg/min at 09/16/18 1017  . potassium PHOSPHATE 45 mmol in dextrose 5 % 500 mL infusion  45 mmol Intravenous Once Tukov-Yual, Magdalene S, NP 86 mL/hr at 09/16/18 0829    . traZODone (DESYREL) tablet 25 mg  25 mg Oral QHS PRN Amelia Jo, MD          Allergies: Allergies  Allergen Reactions  . Omeprazole     SEVERE STOMACH PAIN      Past Medical History: Past Medical History:  Diagnosis Date  . Skin cancer    History of melanoma     Past Surgical History: Past Surgical History:  Procedure Laterality Date  . MELANOMA EXCISION       Family History: History reviewed. No pertinent family history.   Social History: Social History   Socioeconomic History  . Marital status: Single    Spouse name: Not on file  . Number of children: Not on file  . Years of education: Not on file  . Highest education level: Not on file  Occupational History  . Not on file  Social Needs  . Financial resource strain: Not on file  . Food insecurity:    Worry: Not on file    Inability: Not on file  . Transportation needs:    Medical: Not on file    Non-medical: Not on file  Tobacco Use  . Smoking status: Never Smoker  . Smokeless tobacco: Never Used  Substance and Sexual Activity  . Alcohol use: Never    Frequency: Never  . Drug use: Never  . Sexual activity: Not on file  Lifestyle  . Physical activity:    Days per week: Not on file    Minutes per session: Not on file  . Stress: Not on file  Relationships  . Social connections:    Talks on phone: Not on file    Gets together: Not on file    Attends religious service: Not on file    Active member of club or organization: Not on file    Attends meetings of clubs or organizations: Not on file    Relationship status: Not on file  . Intimate partner violence:  Fear of current or ex partner: Not on file    Emotionally  abused: Not on file    Physically abused: Not on file    Forced sexual activity: Not on file  Other Topics Concern  . Not on file  Social History Narrative  . Not on file     Review of Systems: Review of Systems  Constitutional: Positive for malaise/fatigue. Negative for chills and fever.  HENT: Negative for hearing loss, nosebleeds and tinnitus.   Eyes: Negative for blurred vision and double vision.  Respiratory: Negative for cough, hemoptysis and sputum production.   Cardiovascular: Negative for chest pain and palpitations.  Gastrointestinal: Negative for heartburn, nausea and vomiting.  Genitourinary: Positive for dysuria.       Incontinence  Musculoskeletal: Negative for joint pain and myalgias.  Skin: Negative for itching and rash.  Neurological: Positive for weakness. Negative for dizziness.  Endo/Heme/Allergies: Negative for polydipsia. Does not bruise/bleed easily.  Psychiatric/Behavioral: Negative for depression. The patient is not nervous/anxious.      Vital Signs: Blood pressure (!) 88/59, pulse 88, temperature (!) 97.2 F (36.2 C), resp. rate (!) 3, height 6' (1.829 m), weight 74.8 kg, SpO2 94 %.  Weight trends: Filed Weights   09/15/18 2124  Weight: 74.8 kg    Physical Exam: General: Critically ill appearing  Head: Normocephalic, atraumatic.  Eyes: Anicteric, EOMI  Nose: Mucous membranes moist, not inflammed, nonerythematous.  Throat: Oropharynx nonerythematous, no exudate appreciated.   Neck: Supple, trachea midline.  Lungs:  Normal respiratory effort. Clear to auscultation BL without crackles or wheezes.  Heart: RRR. S1 and S2 normal without gallop, murmur, or rubs.  Abdomen:  BS normoactive. Soft, Nondistended, non-tender.  No masses or organomegaly.  Extremities: No pretibial edema.  Neurologic: Awake, alert, follows simple commands  Skin: No visible rashes, scars.    Lab results: Basic Metabolic Panel: Recent Labs  Lab 09/15/18 2129  09/16/18 0055 09/16/18 0507  NA 136 139 138  K 2.9* 3.0* 3.0*  CL 101 107 107  CO2 17* 17* 18*  GLUCOSE 113* 112* 122*  BUN 67* 64* 69*  CREATININE 5.35* 5.17* 5.35*  CALCIUM 7.7* 7.0* 7.0*  MG  --  1.9  --   PHOS  --  1.6*  --     Liver Function Tests: Recent Labs  Lab 09/15/18 2129 09/16/18 0055  AST 84* 90*  ALT 66* 63*  ALKPHOS 488* 392*  BILITOT 2.1* 2.1*  PROT 6.6 5.5*  ALBUMIN 2.3* 1.9*   No results for input(s): LIPASE, AMYLASE in the last 168 hours. No results for input(s): AMMONIA in the last 168 hours.  CBC: Recent Labs  Lab 09/15/18 2129 09/16/18 0507  WBC 10.5 58.6*  NEUTROABS 9.7*  --   HGB 12.0* 9.4*  HCT 36.3* 27.4*  MCV 87.9 87.0  PLT 248 227    Cardiac Enzymes: Recent Labs  Lab 09/15/18 2129 09/16/18 0055 09/16/18 0507  CKTOTAL 26*  --   --   TROPONINI 0.18* 1.33* 1.82*    BNP: Invalid input(s): POCBNP  CBG: Recent Labs  Lab 09/16/18 0018 09/16/18 0945  GLUCAP 112* 133*    Microbiology: Results for orders placed or performed during the hospital encounter of 09/15/18  Blood Culture (routine x 2)     Status: None (Preliminary result)   Collection Time: 09/15/18  9:29 PM  Result Value Ref Range Status   Specimen Description BLOOD RIGHT ARM  Final   Special Requests   Final  BOTTLES DRAWN AEROBIC AND ANAEROBIC Blood Culture results may not be optimal due to an excessive volume of blood received in culture bottles   Culture  Setup Time   Final    AEROBIC BOTTLE ONLY GRAM NEGATIVE RODS CRITICAL VALUE NOTED.  VALUE IS CONSISTENT WITH PREVIOUSLY REPORTED AND CALLED VALUE. Performed at [facility], [address] [place], [place] [address]    Culture GRAM NEGATIVE RODS  Final   Report Status PENDING  Incomplete  Blood Culture (routine x 2)     Status: None (Preliminary result)   Collection Time: 09/15/18  9:29 PM  Result Value Ref Range Status   Specimen Description BLOOD LEFT ARM  Final   Special Requests    Final    BOTTLES DRAWN AEROBIC AND ANAEROBIC Blood Culture adequate volume   Culture  Setup Time   Final    Organism ID to follow IN BOTH AEROBIC AND ANAEROBIC BOTTLES GRAM NEGATIVE RODS CRITICAL RESULT CALLED TO, READ BACK BY AND VERIFIED WITH: Performed at [facility], [address] [place], [place] [address]    Culture GRAM NEGATIVE RODS  Final   Report Status PENDING  Incomplete  MRSA PCR Screening     Status: None   Collection Time: 09/16/18 12:14 AM  Result Value Ref Range Status   MRSA by PCR NEGATIVE NEGATIVE Final    Comment:        The GeneXpert MRSA Assay (FDA approved for NASAL specimens only), is one component of a comprehensive MRSA colonization surveillance program. It is not intended to diagnose MRSA infection nor to guide or monitor treatment for MRSA infections. Performed at [facility], [address] [place], [place] [address]     Coagulation Studies: Recent Labs    09/15/18 11/11/2127  LABPROT 17.2*  INR 1.42    Urinalysis: Recent Labs    09/15/18 11-10-2200  COLORURINE YELLOW*  LABSPEC 1.005  PHURINE 6.0  GLUCOSEU NEGATIVE  HGBUR MODERATE*  BILIRUBINUR NEGATIVE  KETONESUR NEGATIVE  PROTEINUR 30*  NITRITE NEGATIVE  LEUKOCYTESUR LARGE*      Imaging: Ct Chest Wo Contrast  Result Date: 09/15/2018 CLINICAL DATA:  Weakness for the past week with fever. Patient has apparently been laying in his own urine and feces for on certain length of time. EXAM: CT CHEST, ABDOMEN AND PELVIS WITHOUT CONTRAST TECHNIQUE: Multidetector CT imaging of the chest, abdomen and pelvis was performed following the standard protocol without IV contrast. COMPARISON:  None. FINDINGS: CT CHEST FINDINGS Cardiovascular: Conventional branch pattern of the great vessels. Atherosclerosis is noted at the origin of the left subclavian artery. 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 Normal heart size without pericardial effusion. Left main and three-vessel  coronary arteriosclerosis predominantly along the left main and LAD. The unenhanced pulmonary vasculature is unremarkable. Mediastinum/Nodes: There is mediastinal shift to the right from volume loss. No lymphadenopathy. Trachea and mainstem bronchi appear patent. The esophagus is unremarkable. The thyroid gland is not enlarged. 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 No pneumothorax. Subpleural areas of scarring at the lung bases. No dominant mass. Minimal scarring at the left lung apex. Musculoskeletal: Remote right-sided rib fractures. No acute nor suspicious osseous lesions. CT ABDOMEN PELVIS FINDINGS Hepatobiliary: No focal liver abnormality is seen. No gallstones, gallbladder wall thickening, or biliary dilatation. Pancreas: Unremarkable. No pancreatic ductal dilatation or surrounding inflammatory changes. Spleen: Normal in size without focal abnormality. Adrenals/Urinary  Tract: Normal bilateral adrenal glands. Marked bilateral hydroureteronephrosis extending to the urinary bladder where it is circumferentially thick-walled in appearance up to 12 mm. The urinary bladder is decompressed by an indwelling Foley catheter. A discrete mass or calculus is not identified however. Stomach/Bowel: The stomach is physiologically distended. Normal small bowel rotation without obstruction or inflammation. Distal and terminal ileum are unremarkable. Normal appearing appendix is visualized. No bowel obstruction or inflammation. Vascular/Lymphatic: Aortoiliac atherosclerosis. No aneurysm. No adenopathy. Reproductive: Within normal limits appearance of the prostate gland. Seminal vesicles are unremarkable. Other: Fat containing inguinal hernia on the left. No abdominopelvic ascites. Musculoskeletal: No aggressive osseous lesions. 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 IMPRESSION: 1. 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 2. 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 Recommend annual imaging followup by CTA or MRA. This recommendation follows 2010 ACCF/AHA/AATS/ACR/ASA/SCA/SCAI/SIR/STS/SVM Guidelines for the Diagnosis and Management of Patients with Thoracic Aortic Disease. 2010; 121: e266-e369. 3. Marked bilateral hydroureteronephrosis extending to the urinary bladder where it is circumferentially thick-walled up to 12 mm. The urinary bladder is decompressed by indwelling Foley catheter. A discrete mass or calculus is not identified however. Findings may be due to mural thickening possibly from chronic cystitis. 4. Fat containing left inguinal hernia. 5. 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 Aortic Atherosclerosis (ICD10-I70.0). Electronically Signed   By: David  Kwon M.D.   On: 09/15/2018 23:20   Us Renal  Result Date: 09/16/2018 CLINICAL DATA:  Renal failure EXAM: RENAL / URINARY TRACT ULTRASOUND COMPLETE COMPARISON:  CT abdomen 09/15/2018 FINDINGS: Right Kidney: Renal measurements: 30.4 x 6.3 x 5 cm = volume: 223 mL. Mild increased renal cortical echogenicity. No renal mass. Mild dilatation of right renal pelvis without significant calyceal dilatation. Left Kidney: Renal measurements: 12.4 x 5.7 x 5.6 cm = volume: 212 mL. Mild increased renal cortical echogenicity. No renal mass. Moderate dilatation of right renal pelvis without significant calyceal dilatation. Bladder: Irregular thickened bladder wall with a Foley catheter present. Prevoid bladder volume 51.8 mL. IMPRESSION: 1. Mild right and moderate left renal pelvis dilatation without significant calyceal dilatation. 2. Mild increased renal cortical echogenicity bilaterally as can be seen with medical renal disease versus intrinsic bladder wall abnormality. 3. Severely thickened bladder wall as can be seen with bladder outlet obstruction. Electronically  Signed   By: Hetal  Patel   On: 09/16/2018 08:57   Dg Chest Port 1 View  Result Date: 09/15/2018 CLINICAL DATA:  Fever. EXAM: PORTABLE CHEST 1 VIEW COMPARISON:  None. FINDINGS: Cardiomediastinal silhouette is normal. Trachea shifted to the RIGHT with RIGHT apical pleural thickening. Bilateral calcified pleural plaques and RIGHT lung pleural thickening. No pleural effusion or focal consolidation. Small potential pneumothorax. Soft tissue planes and included osseous structures are non suspicious. IMPRESSION: 1. Small potential RIGHT pneumothorax. Calcified pleural plaques which may obscure underlying acute process. RIGHT apical pleural thickening and scarring. 2. Acute findings discussed with and reconfirmed by Dr.JAMES MCSHANE on 09/15/2018 at 10:19 pm. Electronically Signed   By: Courtnay  Bloomer M.D.   On: 09/15/2018 22:22   Ct Renal Stone Study  Result Date: 09/15/2018 CLINICAL DATA:  Weakness for the past week with fever. Patient has apparently been laying in his own urine and feces for on certain length of time. EXAM: CT CHEST, ABDOMEN AND PELVIS WITHOUT CONTRAST TECHNIQUE: Multidetector CT imaging of the chest, abdomen and pelvis was performed following the standard protocol without IV contrast. COMPARISON:  None. FINDINGS:   CT CHEST FINDINGS Cardiovascular: Conventional branch pattern of the great vessels. Atherosclerosis is noted at the origin of the left subclavian artery. 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 Normal heart size without pericardial effusion. Left main and three-vessel coronary arteriosclerosis predominantly along the left main and LAD. The unenhanced pulmonary vasculature is unremarkable. Mediastinum/Nodes: There is mediastinal shift to the right from volume loss. No lymphadenopathy. Trachea and mainstem bronchi appear patent. The esophagus is unremarkable. The thyroid gland is not enlarged. Lungs/Pleura: Diffuse bilateral calcified pleural plaque consistent with prior  asbestos exposure with scarring at the right lung apex with bronchiectasis noted. No pneumothorax. Subpleural areas of scarring at the lung bases. No dominant mass. Minimal scarring at the left lung apex. Musculoskeletal: Remote right-sided rib fractures. No acute nor suspicious osseous lesions. CT ABDOMEN PELVIS FINDINGS Hepatobiliary: No focal liver abnormality is seen. No gallstones, gallbladder wall thickening, or biliary dilatation. Pancreas: Unremarkable. No pancreatic ductal dilatation or surrounding inflammatory changes. Spleen: Normal in size without focal abnormality. Adrenals/Urinary Tract: Normal bilateral adrenal glands. Marked bilateral hydroureteronephrosis extending to the urinary bladder where it is circumferentially thick-walled in appearance up to 12 mm. The urinary bladder is decompressed by an indwelling Foley catheter. A discrete mass or calculus is not identified however. Stomach/Bowel: The stomach is physiologically distended. Normal small bowel rotation without obstruction or inflammation. Distal and terminal ileum are unremarkable. Normal appearing appendix is visualized. No bowel obstruction or inflammation. Vascular/Lymphatic: Aortoiliac atherosclerosis. No aneurysm. No adenopathy. Reproductive: Within normal limits appearance of the prostate gland. Seminal vesicles are unremarkable. Other: Fat containing inguinal hernia on the left. No abdominopelvic ascites. Musculoskeletal: No aggressive osseous lesions. 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 IMPRESSION: 1. 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 2. 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 Recommend annual imaging followup by CTA or MRA. This recommendation follows 2010 ACCF/AHA/AATS/ACR/ASA/SCA/SCAI/SIR/STS/SVM Guidelines for the Diagnosis and Management of Patients with Thoracic Aortic Disease. 2010; 121: [id number]. 3. Marked bilateral  hydroureteronephrosis extending to the urinary bladder where it is circumferentially thick-walled up to 12 mm. The urinary bladder is decompressed by indwelling Foley catheter. A discrete mass or calculus is not identified however. Findings may be due to mural thickening possibly from chronic cystitis. 4. Fat containing left inguinal hernia. 5. 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 Aortic Atherosclerosis (ICD10-I70.0). Electronically Signed   By: David  Kwon M.D.   On: 09/15/2018 23:20   Us Ekg Site Rite  Result Date: 09/16/2018 If Site Rite image not attached, placement could not be confirmed due to current cardiac rhythm.     Assessment & Plan: Pt is a 72 y.o. male with a PMHx of skin cancer, who was admitted to [facility] on 09/15/2018 for evaluation of generalized weakness.   1.  Acute renal failure secondary to hydronephrosis. 2.  Bilateral hydronephrosis. 3.  Hypokalemia. 4.  Hypotension requiring pressors. 5.  Sepsis, suspected urinary source.   Plan:  We are asked to see the patient for evaluation management of acute renal failure.  Back in June of this year his creatinine was normal.  Suspect that acute renal failure now is attributable to hydronephrosis as well as sepsis.  Foley catheter has been placed.  This point time we recommend initiation of renal placement therapy in the form of CRRT.  Case discussed with Dr. Conforti.  He will be placing temporary dialysis catheter and then we will proceed with CRRT using a 4K   bath.  No ultrafiltration at this time as the patient is hypotensive.  Further plan as patient progresses.

## 2018-09-16 NOTE — Progress Notes (Signed)
Deferring PICC line at this time.

## 2018-09-16 NOTE — Consult Note (Addendum)
9:14 AM   Samuel Pitts [birth year] [id number]  HPI: I was asked to see Samuel Pitts in consultation for bilateral hydroureteronephrosis down to the bladder and sepsis from urinary origin by Dr. Benjie Karvonen.  The patient is a poor historian, however from chart review, he was admitted to the ICU last night after being found down at home for unknown amount of time.  Urine was grossly infected and CT scan showed bilateral hydroureteronephrosis down to a thickened bladder.  He was febrile to 101.7, with a lactate of 6, and AKI with creatinine of 5.35. Foley catheter had already been placed at that point and bladder was decompressed.   He is able to confirm that he has had difficulty emptying his bladder with a weak stream for a long period of time.  He also has started wearing diapers overnight for what sounds like overflow incontinence.  He denies any prior Foley catheter placement or urinary tract infections.  He does not seek regular medical care.  There are no aggravating or alleviating factors.  Duration is unknown.  Severity is moderate to severe.  Bladder scan in ICU with Foley catheter in place is 40 cc.   PMH: Past Medical History:  Diagnosis Date  . Skin cancer    History of melanoma    Surgical History: Past Surgical History:  Procedure Laterality Date  . MELANOMA EXCISION      Allergies:  Allergies  Allergen Reactions  . Omeprazole     SEVERE STOMACH PAIN    Family History: History reviewed. No pertinent family history.  Social History:  reports that he has never smoked. He has never used smokeless tobacco. He reports that he does not drink alcohol or use drugs.  ROS: Please see flowsheet from today's date for complete review of systems.  Physical Exam: BP (!) 90/56   Pulse 95   Temp (!) 97.5 F (36.4 C)   Resp (!) 22   Ht 6' (1.829 m)   Wt 74.8 kg   SpO2 98%   BMI 22.38 kg/m    Constitutional: Frail and ill-appearing, cachectic,  disheveled Cardiovascular: No clubbing, cyanosis, or edema. Respiratory: Normal respiratory effort, no increased work of breathing. GI: Abdomen is soft, nontender, nondistended, no abdominal masses GU: No CVA tenderness, phallus without lesions, widely patent meatus 16 French Foley catheter draining faint pink urine, no clots Lymph: No cervical or inguinal lymphadenopathy. Skin: No rashes, bruises or suspicious lesions. Neurologic: Grossly intact, no focal deficits, moving all 4 extremities.  Laboratory Data: Reviewed  Pertinent Imaging: I have personally reviewed the CT stone protocol dated 09/15/2018.  Bilateral hydroureteronephrosis down to thickened bladder, at time of scan bladder is decompressed with a Foley catheter.  Renal ultrasound today shows persistent hydronephrosis bilaterally.  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  Bladder is currently decompressed and draining well with a 16 French Foley catheter with faint pink urine.  PVR 40 cc.  Ultrasound today with persistent hydronephrosis bilaterally, anticipate this will take at least 2 to 3 days to resolve on imaging in the setting of chronic obstruction.  If he does not clinically improve over the next few days we will have to consider additional upper tract drainage with either bilateral ureteral stents or bilateral nephrostomy tubes.  -  Maintain Foley catheter, okay to irrigate as needed.  If catheter not draining recommend RN upsize to 20 [place] two-way Foley to improve drainage -Monitor for post-obstructive diuresis -Follow-up cultures and narrow antibiotics as able, recommend 2-week duration -Maintain Foley at discharge, start Flomax -I have arranged  follow-up in 10 to 14 days for Foley removal and voiding trial, anticipate he will ultimately need urodynamics as an outpatient to evaluate for atonic bladder versus bladder outlet obstruction   Billey Co, MD  [facility] [address], [place], [place] [address] [phone]

## 2018-09-16 NOTE — Progress Notes (Signed)
eLink Physician-Brief Progress Note Patient Name: Odin Mariani DOB: 1946/02/03 MRN: [id number]   Date of Service  09/16/2018  HPI/Events of Note  Pt admitted with altered mental status, failure to thrive and hypotension of as yet undetermined etiology. Work up is in progress. UTI is suspected.  eICU Interventions  Pt clinical profile reviewed. De-escalate antibiotics  Rx soon.        Kerry Kass Ogan 09/16/2018, 12:29 AM

## 2018-09-17 ENCOUNTER — Inpatient Hospital Stay: Payer: Medicare Other

## 2018-09-17 LAB — RENAL FUNCTION PANEL
Albumin: 1.7 g/dL — ABNORMAL LOW (ref 3.5–5.0)
Albumin: 1.6 g/dL — ABNORMAL LOW (ref 3.5–5.0)
Albumin: 1.7 g/dL — ABNORMAL LOW (ref 3.5–5.0)
Anion gap: 6 (ref 5–15)
Anion gap: 7 (ref 5–15)
Anion gap: 5 (ref 5–15)
BUN: 32 mg/dL — ABNORMAL HIGH (ref 8–23)
BUN: 31 mg/dL — ABNORMAL HIGH (ref 8–23)
BUN: 28 mg/dL — ABNORMAL HIGH (ref 8–23)
CALCIUM: 7.2 mg/dL — AB (ref 8.9–10.3)
CHLORIDE: 107 mmol/L (ref 98–111)
CO2: 24 mmol/L (ref 22–32)
CO2: 24 mmol/L (ref 22–32)
CO2: 25 mmol/L (ref 22–32)
Calcium: 7.2 mg/dL — ABNORMAL LOW (ref 8.9–10.3)
Calcium: 7.3 mg/dL — ABNORMAL LOW (ref 8.9–10.3)
Chloride: 109 mmol/L (ref 98–111)
Chloride: 109 mmol/L (ref 98–111)
Creatinine, Ser: 2.14 mg/dL — ABNORMAL HIGH (ref 0.61–1.24)
Creatinine, Ser: 2.04 mg/dL — ABNORMAL HIGH (ref 0.61–1.24)
Creatinine, Ser: 1.64 mg/dL — ABNORMAL HIGH (ref 0.61–1.24)
GFR calc Af Amer: 34 mL/min — ABNORMAL LOW (ref >60)
GFR calc Af Amer: 36 mL/min — ABNORMAL LOW (ref >60)
GFR calc Af Amer: 47 mL/min — ABNORMAL LOW (ref >60)
GFR calc non Af Amer: 30 mL/min — ABNORMAL LOW (ref >60)
GFR calc non Af Amer: 31 mL/min — ABNORMAL LOW (ref >60)
GFR calc non Af Amer: 41 mL/min — ABNORMAL LOW (ref >60)
Glucose, Bld: 126 mg/dL — ABNORMAL HIGH (ref 70–99)
Glucose, Bld: 128 mg/dL — ABNORMAL HIGH (ref 70–99)
Glucose, Bld: 181 mg/dL — ABNORMAL HIGH (ref 70–99)
PHOSPHORUS: 2.5 mg/dL (ref 2.5–4.6)
POTASSIUM: 3.6 mmol/L (ref 3.5–5.1)
Phosphorus: 2.7 mg/dL (ref 2.5–4.6)
Phosphorus: 2.3 mg/dL — ABNORMAL LOW (ref 2.5–4.6)
Potassium: 3.9 mmol/L (ref 3.5–5.1)
Potassium: 3.9 mmol/L (ref 3.5–5.1)
Sodium: 139 mmol/L (ref 135–145)
Sodium: 138 mmol/L (ref 135–145)
Sodium: 139 mmol/L (ref 135–145)

## 2018-09-17 LAB — COMPREHENSIVE METABOLIC PANEL
ALT: 43 U/L (ref 0–44)
AST: 43 U/L — ABNORMAL HIGH (ref 15–41)
Albumin: 1.8 g/dL — ABNORMAL LOW (ref 3.5–5.0)
Alkaline Phosphatase: 238 U/L — ABNORMAL HIGH (ref 38–126)
Anion gap: 7 (ref 5–15)
BUN: 33 mg/dL — ABNORMAL HIGH (ref 8–23)
CO2: 24 mmol/L (ref 22–32)
Calcium: 7.2 mg/dL — ABNORMAL LOW (ref 8.9–10.3)
Chloride: 108 mmol/L (ref 98–111)
Creatinine, Ser: 2.03 mg/dL — ABNORMAL HIGH (ref 0.61–1.24)
GFR calc Af Amer: 37 mL/min — ABNORMAL LOW (ref >60)
GFR calc non Af Amer: 32 mL/min — ABNORMAL LOW (ref >60)
Glucose, Bld: 140 mg/dL — ABNORMAL HIGH (ref 70–99)
Potassium: 3.7 mmol/L (ref 3.5–5.1)
SODIUM: 139 mmol/L (ref 135–145)
Total Bilirubin: 0.6 mg/dL (ref 0.3–1.2)
Total Protein: 5.3 g/dL — ABNORMAL LOW (ref 6.5–8.1)

## 2018-09-17 LAB — CBC
HCT: 25.7 % — ABNORMAL LOW (ref 39.0–52.0)
HCT: 27.3 % — ABNORMAL LOW (ref 39.0–52.0)
Hemoglobin: 8.8 g/dL — ABNORMAL LOW (ref 13.0–17.0)
Hemoglobin: 9.4 g/dL — ABNORMAL LOW (ref 13.0–17.0)
MCH: 29.2 pg (ref 26.0–34.0)
MCH: 29.7 pg (ref 26.0–34.0)
MCHC: 34.2 g/dL (ref 30.0–36.0)
MCHC: 34.4 g/dL (ref 30.0–36.0)
MCV: 85.4 fL (ref 80.0–100.0)
MCV: 86.1 fL (ref 80.0–100.0)
NRBC: 0 % (ref 0.0–0.2)
Platelets: 165 10*3/uL (ref 150–400)
Platelets: 162 10*3/uL (ref 150–400)
RBC: 3.01 MIL/uL — ABNORMAL LOW (ref 4.22–5.81)
RBC: 3.17 MIL/uL — ABNORMAL LOW (ref 4.22–5.81)
RDW: 14.6 % (ref 11.5–15.5)
RDW: 14.6 % (ref 11.5–15.5)
WBC: 64.6 10*3/uL — AB (ref 4.0–10.5)
WBC: 59.2 10*3/uL — AB (ref 4.0–10.5)
nRBC: 0 % (ref 0.0–0.2)

## 2018-09-17 LAB — MAGNESIUM
Magnesium: 2.1 mg/dL (ref 1.7–2.4)
Magnesium: 2 mg/dL (ref 1.7–2.4)
Magnesium: 2 mg/dL (ref 1.7–2.4)
Magnesium: 2 mg/dL (ref 1.7–2.4)

## 2018-09-17 LAB — TROPONIN I: Troponin I: 1.08 ng/mL (ref <0.03)

## 2018-09-17 LAB — GLUCOSE, CAPILLARY: GLUCOSE-CAPILLARY: 127 mg/dL — AB (ref 70–99)

## 2018-09-17 LAB — PHOSPHORUS: Phosphorus: 2.7 mg/dL (ref 2.5–4.6)

## 2018-09-17 MED ORDER — DEXTROMETHORPHAN POLISTIREX ER 30 MG/5ML PO SUER
30.0000 mg | Freq: Two times a day (BID) | ORAL | Status: DC | PRN
  Administered 2018-09-17: 30 mg via ORAL
  Filled 2018-09-17 (×2): qty 5

## 2018-09-17 MED ORDER — TAMSULOSIN HCL 0.4 MG PO CAPS
0.4000 mg | ORAL_CAPSULE | Freq: Every day | ORAL | Status: DC
  Administered 2018-09-17 – 2018-09-18 (×2): 0.4 mg via ORAL
  Filled 2018-09-17 (×2): qty 1

## 2018-09-17 MED ORDER — DM-GUAIFENESIN ER 30-600 MG PO TB12: 1.0000 | ORAL_TABLET | Freq: Two times a day (BID) | ORAL | Status: DC | PRN

## 2018-09-17 MED ORDER — GUAIFENESIN ER 600 MG PO TB12
600.0000 mg | ORAL_TABLET | Freq: Two times a day (BID) | ORAL | Status: DC | PRN
  Administered 2018-09-17 – 2018-09-18 (×3): 600 mg via ORAL
  Filled 2018-09-17 (×3): qty 1

## 2018-09-17 NOTE — Progress Notes (Signed)
Patient refused CXR x 2.

## 2018-09-17 NOTE — Progress Notes (Signed)
[facility]  ROUNDING NOTE   Subjective:  Output has improved. Patient was started on CRRT yesterday. Appears to be a bit more awake and alert today.   Objective:  Vital signs in last 24 hours:  Temp:  [95.4 F (35.2 C)-97.5 F (36.4 C)] 97.2 F (36.2 C) (12/08 1100) Pulse Rate:  [72-98] 72 (12/08 1100) Resp:  [0-37] 15 (12/08 1100) BP: (89-119)/(59-80) 89/60 (12/08 1100) SpO2:  [97 %-100 %] 98 % (12/08 1100) Weight:  [83.6 kg] 83.6 kg (12/08 0425)  Weight change: 8.756 kg Filed Weights   09/15/18 2124 09/17/18 0425  Weight: 74.8 kg 83.6 kg    Intake/Output: I/O last 3 completed shifts: In: 7275.7 [P.O.:240; I.V.:3974.7; Other:20; IV [id number]:5638] Out: 3885 [Urine:3885]   Intake/Output this shift:  Total I/O In: 1591.4 [I.V.:1491.4; IV Piggyback:100] Out: 40 [Urine:40]  Physical Exam: General: No acute distress  Head: Normocephalic, atraumatic. Moist oral mucosal membranes  Eyes: Anicteric  Neck: Supple, trachea midline  Lungs:  Clear to auscultation, normal effort  Heart: S1S2 no rubs  Abdomen:  Soft, nontender, bowel sounds present  Extremities: 1+ peripheral edema.  Neurologic: Awake, alert, following commands  Skin: No lesions  Access: Right temporary femoral dialysis catheter    Basic Metabolic Panel: Recent Labs  Lab 09/16/18 1842 09/16/18 2241 09/17/18 0310 09/17/18 0637 09/17/18 1040  NA 139 140 139 138 139  K 3.8 3.9 3.9 3.9 3.6  CL 109 109 109 107 109  CO2 20* 22 24 24 25   GLUCOSE 163* 148* 126* 128* 181*  BUN 42* 36* 32* 31* 28*  CREATININE 3.14* 2.70* 2.14* 2.04* 1.64*  CALCIUM 7.2* 7.2* 7.2* 7.2* 7.3*  MG 2.2 2.2 2.1 2.0 2.0  PHOS 3.2 2.5 2.5 2.7 2.3*    Liver Function Tests: Recent Labs  Lab 09/15/18 2129 09/16/18 0055  09/16/18 1842 09/16/18 2241 09/17/18 0310 09/17/18 0637 09/17/18 1040  AST 84* 90*  --   --   --   --   --   --   ALT 66* 63*  --   --   --   --   --   --   ALKPHOS 488* 392*  --   --   --    --   --   --   BILITOT 2.1* 2.1*  --   --   --   --   --   --   PROT 6.6 5.5*  --   --   --   --   --   --   ALBUMIN 2.3* 1.9*   < > 1.9* 1.8* 1.7* 1.6* 1.7*   < > = values in this interval not displayed.   No results for input(s): LIPASE, AMYLASE in the last 168 hours. No results for input(s): AMMONIA in the last 168 hours.  CBC: Recent Labs  Lab 09/15/18 2129 09/16/18 0507 09/17/18 0310  WBC 10.5 58.6* 64.6*  NEUTROABS 9.7*  --   --   HGB 12.0* 9.4* 8.8*  HCT 36.3* 27.4* 25.7*  MCV 87.9 87.0 85.4  PLT 248 227 165    Cardiac Enzymes: Recent Labs  Lab 09/15/18 2129 09/16/18 0055 09/16/18 0507 09/16/18 1120 09/16/18 1521 09/17/18 0310  CKTOTAL 26*  --   --   --   --   --   TROPONINI 0.18* 1.33* 1.82* 1.84* 1.23* 1.08*    BNP: Invalid input(s): POCBNP  CBG: Recent Labs  Lab 09/16/18 0018 09/16/18 0945 09/17/18 0730  GLUCAP 112* 133* 127*  Microbiology: Results for orders placed or performed during the hospital encounter of 09/15/18  Blood Culture (routine x 2)     Status: None (Preliminary result)   Collection Time: 09/15/18  9:29 PM  Result Value Ref Range Status   Specimen Description   Final    BLOOD RIGHT ARM Performed at [facility], [address], [place], [place] [address]    Special Requests   Final    BOTTLES DRAWN AEROBIC AND ANAEROBIC Blood Culture results may not be optimal due to an excessive volume of blood received in culture bottles Performed at [facility], [address] [place], [place] [address]    Culture  Setup Time   Final    IN BOTH AEROBIC AND ANAEROBIC BOTTLES GRAM NEGATIVE RODS CRITICAL VALUE NOTED.  VALUE IS CONSISTENT WITH PREVIOUSLY REPORTED AND CALLED VALUE. Performed at [facility], [address] [place], [place] [address]    Culture   Final    Lonell Grandchild NEGATIVE RODS IDENTIFICATION AND SUSCEPTIBILITIES TO FOLLOW Performed at [facility], [place] [address], [place], [place]  [address]    Report Status PENDING  Incomplete  Blood Culture (routine x 2)     Status: Abnormal (Preliminary result)   Collection Time: 09/15/18  9:29 PM  Result Value Ref Range Status   Specimen Description   Final    BLOOD LEFT ARM Performed at [facility], [address] [place], [place] [address]    Special Requests   Final    BOTTLES DRAWN AEROBIC AND ANAEROBIC Blood Culture adequate volume Performed at [facility], [address] [place], [place] [address]    Culture  Setup Time   Final    IN BOTH AEROBIC AND ANAEROBIC BOTTLES GRAM NEGATIVE RODS CRITICAL RESULT CALLED TO, READ BACK BY AND VERIFIED WITH: MELISSA [place] AT 1059 ON 09/16/18 BY SNJ    Culture (A)  Final    ESCHERICHIA COLI SUSCEPTIBILITIES TO FOLLOW Performed at [facility], [place] [address] [place],  [address]    Report Status PENDING  Incomplete  Blood Culture ID Panel (Reflexed)     Status: Abnormal   Collection Time: 09/15/18  9:29 PM  Result Value Ref Range Status   Enterococcus species NOT DETECTED NOT DETECTED Final   Listeria monocytogenes NOT DETECTED NOT DETECTED Final   Staphylococcus species NOT DETECTED NOT DETECTED Final   Staphylococcus aureus (BCID) NOT DETECTED NOT DETECTED Final   Streptococcus species NOT DETECTED NOT DETECTED Final   Streptococcus agalactiae NOT DETECTED NOT DETECTED Final   Streptococcus pneumoniae NOT DETECTED NOT DETECTED Final   Streptococcus pyogenes NOT DETECTED NOT DETECTED Final   Acinetobacter baumannii NOT DETECTED NOT DETECTED Final   Enterobacteriaceae species DETECTED (A) NOT DETECTED Final    Comment: Enterobacteriaceae represent a large family of gram-negative bacteria, not a single organism. MELISSA MACCIA AT 2694 ON 09/16/18 BY SNJ    Enterobacter cloacae complex NOT DETECTED NOT DETECTED Final   Escherichia coli DETECTED (A) NOT DETECTED Final    Comment: CRITICAL RESULT CALLED TO, READ BACK BY AND VERIFIED  WITH: MELISSA MACCIA AT 1059 ON 09/16/18 BY SNJ    Klebsiella oxytoca NOT DETECTED NOT DETECTED Final   Klebsiella pneumoniae NOT DETECTED NOT DETECTED Final   Proteus species NOT DETECTED NOT DETECTED Final   Serratia marcescens NOT DETECTED NOT DETECTED Final   Carbapenem resistance NOT DETECTED NOT DETECTED Final   Haemophilus influenzae NOT DETECTED NOT DETECTED Final  Neisseria meningitidis NOT DETECTED NOT DETECTED Final   Pseudomonas aeruginosa NOT DETECTED NOT DETECTED Final   Candida albicans NOT DETECTED NOT DETECTED Final   Candida glabrata NOT DETECTED NOT DETECTED Final   Candida krusei NOT DETECTED NOT DETECTED Final   Candida parapsilosis NOT DETECTED NOT DETECTED Final   Candida tropicalis NOT DETECTED NOT DETECTED Final    Comment: Performed at [facility], [address] [place], [place] [address]  Urine culture     Status: Abnormal (Preliminary result)   Collection Time: 09/15/18 10:02 PM  Result Value Ref Range Status   Specimen Description   Final    URINE, RANDOM Performed at [facility], [address], [place], [place] [address]    Special Requests   Final    NONE Performed at [facility], [address] [place], [place] [address]    Culture >=100,000 COLONIES/mL GRAM NEGATIVE RODS (A)  Final   Report Status PENDING  Incomplete  MRSA PCR Screening     Status: None   Collection Time: 09/16/18 12:14 AM  Result Value Ref Range Status   MRSA by PCR NEGATIVE NEGATIVE Final    Comment:        The GeneXpert MRSA Assay (FDA approved for NASAL specimens only), is one component of a comprehensive MRSA colonization surveillance program. It is not intended to diagnose MRSA infection nor to guide or monitor treatment for MRSA infections. Performed at [facility], [address] [place], [place] [address]     Coagulation Studies: Recent Labs    09/15/18 2128/01/19  LABPROT 17.2*  INR 1.42     Urinalysis: Recent Labs    09/15/18 01-18-01  COLORURINE YELLOW*  LABSPEC 1.005  PHURINE 6.0  GLUCOSEU NEGATIVE  HGBUR MODERATE*  BILIRUBINUR NEGATIVE  KETONESUR NEGATIVE  PROTEINUR 30*  NITRITE NEGATIVE  LEUKOCYTESUR LARGE*      Imaging: Ct Chest Wo Contrast  Result Date: 09/15/2018 CLINICAL DATA:  Weakness for the past week with fever. Patient has apparently been laying in his own urine and feces for on certain length of time. EXAM: CT CHEST, ABDOMEN AND PELVIS WITHOUT CONTRAST TECHNIQUE: Multidetector CT imaging of the chest, abdomen and pelvis was performed following the standard protocol without IV contrast. COMPARISON:  None. FINDINGS: CT CHEST FINDINGS Cardiovascular: Conventional branch pattern of the great vessels. Atherosclerosis is noted at the origin of the left subclavian artery. 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 Normal heart size without pericardial effusion. Left main and three-vessel coronary arteriosclerosis predominantly along the left main and LAD. The unenhanced pulmonary vasculature is unremarkable. Mediastinum/Nodes: There is mediastinal shift to the right from volume loss. No lymphadenopathy. Trachea and mainstem bronchi appear patent. The esophagus is unremarkable. The thyroid gland is not enlarged. 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 No pneumothorax. Subpleural areas of scarring at the lung bases. No dominant mass. Minimal scarring at the left lung apex. Musculoskeletal: Remote right-sided rib fractures. No acute nor suspicious osseous lesions. CT ABDOMEN PELVIS FINDINGS Hepatobiliary: No focal liver abnormality is seen. No gallstones, gallbladder wall thickening, or biliary dilatation. Pancreas: Unremarkable. No pancreatic ductal dilatation or surrounding inflammatory changes. Spleen: Normal in size without focal abnormality.  Adrenals/Urinary Tract: Normal bilateral adrenal glands. Marked bilateral hydroureteronephrosis extending to the urinary bladder where it is circumferentially thick-walled in appearance up to 12 mm. The urinary bladder is decompressed by an indwelling Foley  catheter. A discrete mass or calculus is not identified however. Stomach/Bowel: The stomach is physiologically distended. Normal small bowel rotation without obstruction or inflammation. Distal and terminal ileum are unremarkable. Normal appearing appendix is visualized. No bowel obstruction or inflammation. Vascular/Lymphatic: Aortoiliac atherosclerosis. No aneurysm. No adenopathy. Reproductive: Within normal limits appearance of the prostate gland. Seminal vesicles are unremarkable. Other: Fat containing inguinal hernia on the left. No abdominopelvic ascites. Musculoskeletal: No aggressive osseous lesions. 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 IMPRESSION: 1. 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 2. 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 Recommend annual imaging followup by CTA or MRA. This recommendation follows 2010 ACCF/AHA/AATS/ACR/ASA/SCA/SCAI/SIR/STS/SVM Guidelines for the Diagnosis and Management of Patients with Thoracic Aortic Disease. 2010; 121: B716-R678. 3. Marked bilateral hydroureteronephrosis extending to the urinary bladder where it is circumferentially thick-walled up to 12 mm. The urinary bladder is decompressed by indwelling Foley catheter. A discrete mass or calculus is not identified however. Findings may be due to mural thickening possibly from chronic cystitis. 4. Fat containing left inguinal hernia. 5. 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 Aortic Atherosclerosis (ICD10-I70.0). Electronically Signed   By: Ashley Royalty M.D.   On: 09/15/2018 23:20   US Renal  Result Date: 09/16/2018 CLINICAL DATA:  Renal failure EXAM: RENAL /  URINARY TRACT ULTRASOUND COMPLETE COMPARISON:  CT abdomen 09/15/2018 FINDINGS: Right Kidney: Renal measurements: 30.4 x 6.3 x 5 cm = volume: 223 mL. Mild increased renal cortical echogenicity. No renal mass. Mild dilatation of right renal pelvis without significant calyceal dilatation. Left Kidney: Renal measurements: 12.4 x 5.7 x 5.6 cm = volume: 212 mL. Mild increased renal cortical echogenicity. No renal mass. Moderate dilatation of right renal pelvis without significant calyceal dilatation. Bladder: Irregular thickened bladder wall with a Foley catheter present. Prevoid bladder volume 51.8 mL. IMPRESSION: 1. Mild right and moderate left renal pelvis dilatation without significant calyceal dilatation. 2. Mild increased renal cortical echogenicity bilaterally as can be seen with medical renal disease versus intrinsic bladder wall abnormality. 3. Severely thickened bladder wall as can be seen with bladder outlet obstruction. Electronically Signed   By: Kathreen Devoid   On: 09/16/2018 08:57   Dg Chest Port 1 View  Result Date: 09/15/2018 CLINICAL DATA:  Fever. EXAM: PORTABLE CHEST 1 VIEW COMPARISON:  None. FINDINGS: Cardiomediastinal silhouette is normal. Trachea shifted to the RIGHT with RIGHT apical pleural thickening. Bilateral calcified pleural plaques and RIGHT lung pleural thickening. No pleural effusion or focal consolidation. Small potential pneumothorax. Soft tissue planes and included osseous structures are non suspicious. IMPRESSION: 1. Small potential RIGHT pneumothorax. Calcified pleural plaques which may obscure underlying acute process. RIGHT apical pleural thickening and scarring. 2. Acute findings discussed with and reconfirmed by Dr.JAMES MCSHANE on 09/15/2018 at 10:19 pm. Electronically Signed   By: Elon Alas M.D.   On: 09/15/2018 22:22   Ct Renal Stone Study  Result Date: 09/15/2018 CLINICAL DATA:  Weakness for the past week with fever. Patient has apparently been laying in his own  urine and feces for on certain length of time. EXAM: CT CHEST, ABDOMEN AND PELVIS WITHOUT CONTRAST TECHNIQUE: Multidetector CT imaging of the chest, abdomen and pelvis was performed following the standard protocol without IV contrast. COMPARISON:  None. FINDINGS: CT CHEST FINDINGS Cardiovascular: Conventional branch pattern of the great vessels. Atherosclerosis is noted at the origin of the left subclavian artery. 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 Normal heart  size without pericardial effusion. Left main and three-vessel coronary arteriosclerosis predominantly along the left main and LAD. The unenhanced pulmonary vasculature is unremarkable. Mediastinum/Nodes: There is mediastinal shift to the right from volume loss. No lymphadenopathy. Trachea and mainstem bronchi appear patent. The esophagus is unremarkable. The thyroid gland is not enlarged. 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 No pneumothorax. Subpleural areas of scarring at the lung bases. No dominant mass. Minimal scarring at the left lung apex. Musculoskeletal: Remote right-sided rib fractures. No acute nor suspicious osseous lesions. CT ABDOMEN PELVIS FINDINGS Hepatobiliary: No focal liver abnormality is seen. No gallstones, gallbladder wall thickening, or biliary dilatation. Pancreas: Unremarkable. No pancreatic ductal dilatation or surrounding inflammatory changes. Spleen: Normal in size without focal abnormality. Adrenals/Urinary Tract: Normal bilateral adrenal glands. Marked bilateral hydroureteronephrosis extending to the urinary bladder where it is circumferentially thick-walled in appearance up to 12 mm. The urinary bladder is decompressed by an indwelling Foley catheter. A discrete mass or calculus is not identified however. Stomach/Bowel: The stomach is physiologically distended. Normal small bowel rotation  without obstruction or inflammation. Distal and terminal ileum are unremarkable. Normal appearing appendix is visualized. No bowel obstruction or inflammation. Vascular/Lymphatic: Aortoiliac atherosclerosis. No aneurysm. No adenopathy. Reproductive: Within normal limits appearance of the prostate gland. Seminal vesicles are unremarkable. Other: Fat containing inguinal hernia on the left. No abdominopelvic ascites. Musculoskeletal: No aggressive osseous lesions. 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 IMPRESSION: 1. 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 2. 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 Recommend annual imaging followup by CTA or MRA. This recommendation follows 2010 ACCF/AHA/AATS/ACR/ASA/SCA/SCAI/SIR/STS/SVM Guidelines for the Diagnosis and Management of Patients with Thoracic Aortic Disease. 2010; 121: Y814-G818. 3. Marked bilateral hydroureteronephrosis extending to the urinary bladder where it is circumferentially thick-walled up to 12 mm. The urinary bladder is decompressed by indwelling Foley catheter. A discrete mass or calculus is not identified however. Findings may be due to mural thickening possibly from chronic cystitis. 4. Fat containing left inguinal hernia. 5. 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 Aortic Atherosclerosis (ICD10-I70.0). Electronically Signed   By: Ashley Royalty M.D.   On: 09/15/2018 23:20   [place] Ekg Site Rite  Result Date: 09/16/2018 If Site Rite image not attached, placement could not be confirmed due to current cardiac rhythm.    Medications:   . sodium chloride 150 mL/hr at 09/17/18 1100  . meropenem (MERREM) IV 1 g (09/17/18 1146)  . phenylephrine (NEO-SYNEPHRINE) Adult infusion Stopped (09/17/18 0318)  . pureflow 1 mL (09/17/18 1029)   . docusate sodium  100 mg Oral BID  . fentaNYL (SUBLIMAZE) injection  25 mcg Intravenous Once  . heparin  5,000 Units  Subcutaneous Q8H  . hydrocortisone sod succinate (SOLU-CORTEF) inj  50 mg Intravenous Q6H  . lidocaine  10 mL Infiltration Once  . midazolam  1 mg Intravenous Once  . tamsulosin  0.4 mg Oral QPC supper   acetaminophen **OR** acetaminophen, bisacodyl, guaiFENesin **AND** dextromethorphan, heparin, HYDROcodone-acetaminophen, ondansetron **OR** ondansetron (ZOFRAN) IV, traZODone  Assessment/ Plan:  72 y.o. male with a PMHx of skin cancer, who was admitted to [facility] on 09/15/2018 for evaluation of generalized weakness.   1.  Acute renal failure secondary to hydronephrosis. 2.  Bilateral hydronephrosis. 3.  Hypokalemia. 4.  Hypotension requiring pressors. 5.  Sepsis, suspected urinary source.   Plan:  Patient was started on  CRRT yesterday and tolerated well.  However urine output is increased to 2.1 L over the preceding 24 hours.  Therefore we will go ahead and discontinue CRRT at this time.  No additional need for dialysis at the moment.  Consider removing temporary dialysis catheter tomorrow if urine output remains adequate.  Continue Foley drainage for bilateral hydronephrosis.  Otherwise supportive care as per critical care and hospitalist.   LOS: 2 Saharah Sherrow 12/8/201911:50 AM

## 2018-09-17 NOTE — Consult Note (Signed)
Pharmacy consulted to dose adjust for CRRT  Meropenem dose ordered with adjustment for CRRT No other adjustments needed at this time Pharmacy will continue to monitor daily for needed changes  Rayna Sexton, PharmD, BCPS Clinical Pharmacist 09/17/2018 9:47 AM

## 2018-09-17 NOTE — Progress Notes (Addendum)
[facility] NAME: Daron Stutz    MR#:  [id number]  DATE OF BIRTH:  01-04-1946  SUBJECTIVE:  Patient doing well this am    REVIEW OF SYSTEMS:    Review of Systems  Constitutional: Negative.  Negative for chills, fever and malaise/fatigue.  HENT: Negative.  Negative for ear discharge, ear pain, hearing loss, nosebleeds and sore throat.   Eyes: Negative.  Negative for blurred vision and pain.  Respiratory: Negative.  Negative for cough, hemoptysis, shortness of breath and wheezing.   Cardiovascular: Negative.  Negative for chest pain, palpitations and leg swelling.  Gastrointestinal: Negative.  Negative for abdominal pain, blood in stool, diarrhea, nausea and vomiting.  Genitourinary: Negative.  Negative for dysuria.  Musculoskeletal: Negative.  Negative for back pain.  Skin: Negative.   Neurological: Negative for dizziness, tremors, speech change, focal weakness, seizures and headaches.  Endo/Heme/Allergies: Negative.  Does not bruise/bleed easily.  Psychiatric/Behavioral: Negative.  Negative for depression, hallucinations and suicidal ideas.   Tolerating Diet:  yes     DRUG ALLERGIES:   Allergies  Allergen Reactions  . Omeprazole     SEVERE STOMACH PAIN    VITALS:  Blood pressure (!) 89/60, pulse 72, temperature (!) 97.2 F (36.2 C), resp. rate 15, height 6' (1.829 m), weight 83.6 kg, SpO2 98 %.  PHYSICAL EXAMINATION:  Constitutional: Appears well-developed and well-nourished. No distress. HENT: Normocephalic. Marland Kitchen Oropharynx is clear and moist.  Eyes: Conjunctivae are normal. , no scleral icterus.  Neck: No JVD. No tracheal deviation. CVS: RRR, S1/S2 +, no murmurs, no gallops, no carotid bruit.  Pulmonary: Effort and breath sounds normal, no stridor, rhonchi, wheezes, rales.  Abdominal: Soft. BS +,  no distension, tenderness, rebound or guarding.  Musculoskeletal: Normal range of motion. No edema and no  tenderness.  Neuro: awake oriented times three psychiatric: Alert    LABORATORY PANEL:   CBC Recent Labs  Lab 09/17/18 0310  WBC 64.6*  HGB 8.8*  HCT 25.7*  PLT 165   ------------------------------------------------------------------------------------------------------------------  Chemistries  Recent Labs  Lab 09/16/18 0055  09/17/18 1040  NA 139   < > 139  K 3.0*   < > 3.6  CL 107   < > 109  CO2 17*   < > 25  GLUCOSE 112*   < > 181*  BUN 64*   < > 28*  CREATININE 5.17*   < > 1.64*  CALCIUM 7.0*   < > 7.3*  MG 1.9   < > 2.0  AST 90*  --   --   ALT 63*  --   --   ALKPHOS 392*  --   --   BILITOT 2.1*  --   --    < > = values in this interval not displayed.   ------------------------------------------------------------------------------------------------------------------  Cardiac Enzymes Recent Labs  Lab 09/16/18 1120 09/16/18 1521 09/17/18 0310  TROPONINI 1.84* 1.23* 1.08*   ------------------------------------------------------------------------------------------------------------------  RADIOLOGY:  Ct Chest Wo Contrast  Result Date: 09/15/2018 CLINICAL DATA:  Weakness for the past week with fever. Patient has apparently been laying in his own urine and feces for on certain length of time. EXAM: CT CHEST, ABDOMEN AND PELVIS WITHOUT CONTRAST TECHNIQUE: Multidetector CT imaging of the chest, abdomen and pelvis was performed following the standard protocol without IV contrast. COMPARISON:  None. FINDINGS: CT CHEST FINDINGS Cardiovascular: Conventional branch pattern of the great vessels. Atherosclerosis is noted at the origin of the left subclavian artery. Ectasia of  the ascending thoracic aorta to 3.7 cm. Normal heart size without pericardial effusion. Left main and three-vessel coronary arteriosclerosis predominantly along the left main and LAD. The unenhanced pulmonary vasculature is unremarkable. Mediastinum/Nodes: There is mediastinal shift to the right from  volume loss. No lymphadenopathy. Trachea and mainstem bronchi appear patent. The esophagus is unremarkable. The thyroid gland is not enlarged. 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 No pneumothorax. Subpleural areas of scarring at the lung bases. No dominant mass. Minimal scarring at the left lung apex. Musculoskeletal: Remote right-sided rib fractures. No acute nor suspicious osseous lesions. CT ABDOMEN PELVIS FINDINGS Hepatobiliary: No focal liver abnormality is seen. No gallstones, gallbladder wall thickening, or biliary dilatation. Pancreas: Unremarkable. No pancreatic ductal dilatation or surrounding inflammatory changes. Spleen: Normal in size without focal abnormality. Adrenals/Urinary Tract: Normal bilateral adrenal glands. Marked bilateral hydroureteronephrosis extending to the urinary bladder where it is circumferentially thick-walled in appearance up to 12 mm. The urinary bladder is decompressed by an indwelling Foley catheter. A discrete mass or calculus is not identified however. Stomach/Bowel: The stomach is physiologically distended. Normal small bowel rotation without obstruction or inflammation. Distal and terminal ileum are unremarkable. Normal appearing appendix is visualized. No bowel obstruction or inflammation. Vascular/Lymphatic: Aortoiliac atherosclerosis. No aneurysm. No adenopathy. Reproductive: Within normal limits appearance of the prostate gland. Seminal vesicles are unremarkable. Other: Fat containing inguinal hernia on the left. No abdominopelvic ascites. Musculoskeletal: No aggressive osseous lesions. 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 IMPRESSION: 1. 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 2. 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 Recommend annual imaging  followup by CTA or MRA. This recommendation follows 2010 ACCF/AHA/AATS/ACR/ASA/SCA/SCAI/SIR/STS/SVM Guidelines for the Diagnosis and Management of Patients with Thoracic Aortic Disease. 2010; 121: [id number]. 3. Marked bilateral hydroureteronephrosis extending to the urinary bladder where it is circumferentially thick-walled up to 12 mm. The urinary bladder is decompressed by indwelling Foley catheter. A discrete mass or calculus is not identified however. Findings may be due to mural thickening possibly from chronic cystitis. 4. Fat containing left inguinal hernia. 5. 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 Aortic Atherosclerosis (ICD10-I70.0). Electronically Signed   By: Ashley Royalty M.D.   On: 09/15/2018 23:20   US Renal  Result Date: 09/16/2018 CLINICAL DATA:  Renal failure EXAM: RENAL / URINARY TRACT ULTRASOUND COMPLETE COMPARISON:  CT abdomen 09/15/2018 FINDINGS: Right Kidney: Renal measurements: 30.4 x 6.3 x 5 cm = volume: 223 mL. Mild increased renal cortical echogenicity. No renal mass. Mild dilatation of right renal pelvis without significant calyceal dilatation. Left Kidney: Renal measurements: 12.4 x 5.7 x 5.6 cm = volume: 212 mL. Mild increased renal cortical echogenicity. No renal mass. Moderate dilatation of right renal pelvis without significant calyceal dilatation. Bladder: Irregular thickened bladder wall with a Foley catheter present. Prevoid bladder volume 51.8 mL. IMPRESSION: 1. Mild right and moderate left renal pelvis dilatation without significant calyceal dilatation. 2. Mild increased renal cortical echogenicity bilaterally as can be seen with medical renal disease versus intrinsic bladder wall abnormality. 3. Severely thickened bladder wall as can be seen with bladder outlet obstruction. Electronically Signed   By: Kathreen Devoid   On: 09/16/2018 08:57   Dg Chest Port 1 View  Result Date: 09/15/2018 CLINICAL DATA:  Fever. EXAM: PORTABLE CHEST 1 VIEW COMPARISON:  None. FINDINGS:  Cardiomediastinal silhouette is normal. Trachea shifted to the RIGHT  with RIGHT apical pleural thickening. Bilateral calcified pleural plaques and RIGHT lung pleural thickening. No pleural effusion or focal consolidation. Small potential pneumothorax. Soft tissue planes and included osseous structures are non suspicious. IMPRESSION: 1. Small potential RIGHT pneumothorax. Calcified pleural plaques which may obscure underlying acute process. RIGHT apical pleural thickening and scarring. 2. Acute findings discussed with and reconfirmed by Dr.JAMES MCSHANE on 09/15/2018 at 10:19 pm. Electronically Signed   By: Elon Alas M.D.   On: 09/15/2018 22:22   Ct Renal Stone Study  Result Date: 09/15/2018 CLINICAL DATA:  Weakness for the past week with fever. Patient has apparently been laying in his own urine and feces for on certain length of time. EXAM: CT CHEST, ABDOMEN AND PELVIS WITHOUT CONTRAST TECHNIQUE: Multidetector CT imaging of the chest, abdomen and pelvis was performed following the standard protocol without IV contrast. COMPARISON:  None. FINDINGS: CT CHEST FINDINGS Cardiovascular: Conventional branch pattern of the great vessels. Atherosclerosis is noted at the origin of the left subclavian artery. 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 Normal heart size without pericardial effusion. Left main and three-vessel coronary arteriosclerosis predominantly along the left main and LAD. The unenhanced pulmonary vasculature is unremarkable. Mediastinum/Nodes: There is mediastinal shift to the right from volume loss. No lymphadenopathy. Trachea and mainstem bronchi appear patent. The esophagus is unremarkable. The thyroid gland is not enlarged. 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 No pneumothorax. Subpleural areas of scarring at the lung bases. No dominant mass. Minimal scarring at  the left lung apex. Musculoskeletal: Remote right-sided rib fractures. No acute nor suspicious osseous lesions. CT ABDOMEN PELVIS FINDINGS Hepatobiliary: No focal liver abnormality is seen. No gallstones, gallbladder wall thickening, or biliary dilatation. Pancreas: Unremarkable. No pancreatic ductal dilatation or surrounding inflammatory changes. Spleen: Normal in size without focal abnormality. Adrenals/Urinary Tract: Normal bilateral adrenal glands. Marked bilateral hydroureteronephrosis extending to the urinary bladder where it is circumferentially thick-walled in appearance up to 12 mm. The urinary bladder is decompressed by an indwelling Foley catheter. A discrete mass or calculus is not identified however. Stomach/Bowel: The stomach is physiologically distended. Normal small bowel rotation without obstruction or inflammation. Distal and terminal ileum are unremarkable. Normal appearing appendix is visualized. No bowel obstruction or inflammation. Vascular/Lymphatic: Aortoiliac atherosclerosis. No aneurysm. No adenopathy. Reproductive: Within normal limits appearance of the prostate gland. Seminal vesicles are unremarkable. Other: Fat containing inguinal hernia on the left. No abdominopelvic ascites. Musculoskeletal: No aggressive osseous lesions. 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 IMPRESSION: 1. 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 2. 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 Recommend annual imaging followup by CTA or MRA. This recommendation follows 2010 ACCF/AHA/AATS/ACR/ASA/SCA/SCAI/SIR/STS/SVM Guidelines for the Diagnosis and Management of Patients with Thoracic Aortic Disease. 2010; 121: [id number]. 3. Marked bilateral hydroureteronephrosis extending to the urinary bladder where it is circumferentially thick-walled up to 12 mm. The urinary bladder is decompressed by indwelling Foley catheter. A  discrete mass or calculus is not identified however. Findings may be due to mural thickening possibly from chronic cystitis. 4. Fat containing left inguinal hernia. 5. 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 Aortic Atherosclerosis (ICD10-I70.0). Electronically Signed   By: Ashley Royalty M.D.   On: 09/15/2018 23:20   [place] Ekg Site Rite  Result Date: 09/16/2018 If Site Rite image not attached, placement could not be confirmed due to current  cardiac rhythm.    ASSESSMENT AND PLAN:   72 year old male with a history of skin cancer who presented to the emergency room due to generalized weakness.  1.  Septic shock from UTI with bilateral hydro-ureter nephrosis: Patient presented with fever, hypotensive tachycardia and leukocytosis. Patient is off of pressors continue broad-spectrum antibiotics for now and follow-up on final cultures Blood and urine growing E. Coli Change to oral Keflex.  Elevated white blood cell count is due to Solu-Cortef which has now been discontinued. 2.  Bilateral hydroureter nephrosis: Patient evaluated by urology. Maintain Foley catheter for postobstructive diuresis He will need a Foley 10 to 14 days and outpatient voiding trial Continue Flomax   3.  Acute renal failure due to hydronephrosis: Patient did receive CRRT but has been off of this since yesterday.  Creatinine is responding well as well as urine output.   4.  Hypokalemia: Resolved  5.  Elevated troponin in the setting of poor renal clearance and demand ischemia with septic shock  6.  Acute metabolic encephalopathy due to septic shock and acute renal failure: Patient is at baseline  7.  Elevated liver function tests in the setting of septic shock: Repeat in a.m.  Physical therapy consultation for discharge planning   CODE STATUS: full  TOTAL TIME TAKING CARE OF THIS PATIENT: 24 minutes.     POSSIBLE D/C tomorrow Ketrick Matney M.D on 09/17/2018 at 11:59 AM  Between 7am to 6pm - Pager -  [phone] After 6pm go to [email or web address] - password [email or web address] [facility]  Office  [phone]  CC: Primary care physician; Patient, No Pcp Per  Note: This dictation was prepared with Dragon dictation along with smaller phrase technology. Any transcriptional errors that result from this process are unintentional.

## 2018-09-17 NOTE — Progress Notes (Signed)
Pharmacy Electrolyte Monitoring Consult:  Pharmacy consulted to assist in monitoring and replacing electrolytes in this 72 y.o. male admitted on 09/15/2018 with Weakness   Labs:  Sodium (mmol/L)  Date Value  09/17/2018 138   Potassium (mmol/L)  Date Value  09/17/2018 3.9   Magnesium (mg/dL)  Date Value  09/17/2018 2.0   Phosphorus (mg/dL)  Date Value  09/17/2018 2.7   Calcium (mg/dL)  Date Value  09/17/2018 7.2 (L)   Albumin (g/dL)  Date Value  09/17/2018 1.6 (L)     Assessment/Plan: On CRRT K 3.9, Mag 2.0, Phos 2.7 - no supplementation needed at this time   Will recheck labs in AM  Rayna Sexton, PharmD, BCPS Clinical Pharmacist 09/17/2018 9:37 AM

## 2018-09-17 NOTE — Progress Notes (Signed)
   Subjective Excellent UOP overnight, urine clear yellow Denies any flank or abdominal pain More alert this AM Getting CRRT  Physical Exam: BP 110/78   Pulse 81   Temp (!) 97.2 F (36.2 C)   Resp 20   Ht 6' (1.829 m)   Wt 83.6 kg   SpO2 97%   BMI 25.00 kg/m    Constitutional:  No acute distress. Respiratory: Normal respiratory effort, no increased work of breathing. 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  Bladder is currently decompressed and draining well with a 16 French Foley catheter with clear yellow urine, excellent UOP.  Do not anticipate need for ureteral stent placement or nephrostomy tubes.  -Maintain Foley catheter, okay to irrigate as needed.  If catheter not draining recommend RN upsize to 62 [place] two-way Foley to improve drainage -Follow-up cultures and narrow antibiotics as able, recommend 2-week duration -Maintain Foley at discharge, start Flomax -I have arranged follow-up in 10 to 14 days for Foley removal and voiding trial, anticipate he will ultimately need urodynamics as an outpatient to evaluate for atonic bladder versus bladder outlet obstruction  -Call if questions   Billey Co, MD

## 2018-09-17 NOTE — Progress Notes (Addendum)
Follow up - Critical Care Medicine Note  Patient Details:    Samuel Pitts is an 72 y.o. male. with a medical history as indicated presented to the ED via EMS after being found by family lying on the couch in urine and fecal matter. History is obtained from ED records as patient is currently confused. When EMS arrived, patient was disheveled, and covered with urine and fecal matter. He was transported to the ED where he was found to be tachycardic, tachypneic, confused, lethargic and had a fever of 101.7. His ED work-up was significant for multiple electrolyte abnormalities, mildly elevated troponin, elevated lactic acid, acute renal failure with a creatinine of 5.17, elevated LFTs,UTI and bilateral hydroureteronephrosisextends into the urinary bladder.  She was admitted to the intensive care unit, started on CRRT.  Lines, Airways, Drains: Urethral Catheter april, rn Temperature probe;Straight-tip (Active)  Indication for Insertion or Continuance of Catheter Unstable critical patients (first 24-48 hours) 09/17/2018  3:15 AM  Site Assessment Clean;Intact;Dry 09/17/2018  3:15 AM  Catheter Maintenance Bag below level of bladder;Catheter secured;Drainage bag/tubing not touching floor;Insertion date on drainage bag;No dependent loops;Seal intact 09/17/2018  3:15 AM  Collection Container Standard drainage bag 09/17/2018  3:15 AM  Securement Method Securing device (Describe) 09/17/2018  3:15 AM  Urinary Catheter Interventions Unclamped 09/17/2018  3:15 AM  Input (mL) 20 mL 09/16/2018  4:10 AM  Output (mL) 1350 mL 09/17/2018  4:49 AM    Anti-infectives:  Anti-infectives (From admission, onward)   Start     Dose/Rate Route Frequency Ordered Stop   09/16/18 2200  ceFEPIme (MAXIPIME) 1 g in sodium chloride 0.9 % 100 mL IVPB  Status:  Discontinued     1 g 200 mL/hr over 30 Minutes Intravenous Every 24 hours 09/15/18 2251 09/16/18 1113   09/16/18 1200  meropenem (MERREM) 1 g in sodium chloride 0.9 %  100 mL IVPB     1 g 200 mL/hr over 30 Minutes Intravenous Every 8 hours 09/16/18 1113     09/16/18 0700  vancomycin (VANCOCIN) 1,250 mg in sodium chloride 0.9 % 250 mL IVPB     1,250 mg 166.7 mL/hr over 90 Minutes Intravenous  Once 09/16/18 0653 09/16/18 1144   09/15/18 2130  ceFEPIme (MAXIPIME) 2 g in sodium chloride 0.9 % 100 mL IVPB     2 g 200 mL/hr over 30 Minutes Intravenous  Once 09/15/18 2125 09/15/18 2223   09/15/18 2130  metroNIDAZOLE (FLAGYL) IVPB 500 mg  Status:  Discontinued     500 mg 100 mL/hr over 60 Minutes Intravenous Every 8 hours 09/15/18 2125 09/16/18 1113   09/15/18 2130  vancomycin (VANCOCIN) IVPB 1000 mg/200 mL premix     1,000 mg 200 mL/hr over 60 Minutes Intravenous  Once 09/15/18 2125 09/15/18 2324      Microbiology: Results for orders placed or performed during the hospital encounter of 09/15/18  Blood Culture (routine x 2)     Status: None (Preliminary result)   Collection Time: 09/15/18  9:29 PM  Result Value Ref Range Status   Specimen Description BLOOD RIGHT ARM  Final   Special Requests   Final    BOTTLES DRAWN AEROBIC AND ANAEROBIC Blood Culture results may not be optimal due to an excessive volume of blood received in culture bottles   Culture  Setup Time   Final    IN BOTH AEROBIC AND ANAEROBIC BOTTLES GRAM NEGATIVE RODS CRITICAL VALUE NOTED.  VALUE IS CONSISTENT WITH PREVIOUSLY REPORTED AND CALLED VALUE. Performed at [facility], [address], [place], [place] [address]    Culture GRAM NEGATIVE RODS  Final   Report Status PENDING  Incomplete  Blood Culture (routine x 2)     Status: None (Preliminary result)   Collection Time: 09/15/18  9:29 PM  Result Value Ref Range Status   Specimen Description   Final    BLOOD LEFT ARM Performed at [facility], [address], [place], [place] [address]    Special Requests   Final    BOTTLES DRAWN AEROBIC AND ANAEROBIC Blood Culture adequate volume Performed at [facility], [address] [place], [place] [address]    Culture  Setup Time   Final    IN BOTH AEROBIC AND ANAEROBIC BOTTLES GRAM NEGATIVE RODS CRITICAL RESULT CALLED TO, READ BACK BY AND VERIFIED WITH: MELISSA Bern AT 1059 ON 09/16/18 BY SNJ Performed at [facility], [address], [place], [place] [address]    Culture GRAM NEGATIVE RODS  Final   Report Status PENDING  Incomplete  Blood Culture ID Panel (Reflexed)     Status: Abnormal   Collection Time: 09/15/18  9:29 PM  Result Value Ref Range Status   Enterococcus species NOT DETECTED NOT DETECTED Final   Listeria monocytogenes NOT DETECTED NOT DETECTED Final   Staphylococcus species NOT DETECTED NOT DETECTED Final   Staphylococcus aureus (BCID) NOT DETECTED NOT DETECTED Final   Streptococcus species NOT DETECTED NOT DETECTED Final   Streptococcus agalactiae NOT DETECTED NOT DETECTED Final   Streptococcus pneumoniae NOT DETECTED NOT DETECTED Final   Streptococcus pyogenes NOT DETECTED NOT DETECTED Final   Acinetobacter baumannii NOT DETECTED NOT DETECTED Final   Enterobacteriaceae species DETECTED (A) NOT DETECTED Final    Comment: Enterobacteriaceae represent a large family of gram-negative bacteria, not a single organism. MELISSA MACCIA AT 4268 ON 09/16/18 BY SNJ    Enterobacter cloacae complex NOT DETECTED NOT DETECTED Final   Escherichia coli DETECTED (A) NOT DETECTED Final    Comment: CRITICAL RESULT CALLED TO, READ BACK BY AND VERIFIED WITH: MELISSA [place] AT 1059 ON 09/16/18 BY SNJ    Klebsiella oxytoca NOT DETECTED NOT DETECTED Final   Klebsiella pneumoniae NOT DETECTED NOT DETECTED Final   Proteus species NOT DETECTED NOT DETECTED Final   Serratia marcescens NOT DETECTED NOT DETECTED Final   Carbapenem resistance NOT DETECTED NOT DETECTED Final   Haemophilus influenzae NOT DETECTED NOT DETECTED Final   Neisseria meningitidis NOT DETECTED NOT DETECTED Final   Pseudomonas aeruginosa NOT DETECTED NOT  DETECTED Final   Candida albicans NOT DETECTED NOT DETECTED Final   Candida glabrata NOT DETECTED NOT DETECTED Final   Candida krusei NOT DETECTED NOT DETECTED Final   Candida parapsilosis NOT DETECTED NOT DETECTED Final   Candida tropicalis NOT DETECTED NOT DETECTED Final    Comment: Performed at [facility], [address] [place], [place] [address]  MRSA PCR Screening     Status: None   Collection Time: 09/16/18 12:14 AM  Result Value Ref Range Status   MRSA by PCR NEGATIVE NEGATIVE Final    Comment:        The GeneXpert MRSA Assay (FDA approved for NASAL specimens only), is one component of a comprehensive MRSA colonization surveillance program. It is not intended to diagnose MRSA infection nor to guide or monitor treatment for MRSA infections. Performed at [facility], [address], [place], [place] [address]    Studies: Ct Chest Wo Contrast  Result Date:  09/15/2018 CLINICAL DATA:  Weakness for the past week with fever. Patient has apparently been laying in his own urine and feces for on certain length of time. EXAM: CT CHEST, ABDOMEN AND PELVIS WITHOUT CONTRAST TECHNIQUE: Multidetector CT imaging of the chest, abdomen and pelvis was performed following the standard protocol without IV contrast. COMPARISON:  None. FINDINGS: CT CHEST FINDINGS Cardiovascular: Conventional branch pattern of the great vessels. Atherosclerosis is noted at the origin of the left subclavian artery. 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 Normal heart size without pericardial effusion. Left main and three-vessel coronary arteriosclerosis predominantly along the left main and LAD. The unenhanced pulmonary vasculature is unremarkable. Mediastinum/Nodes: There is mediastinal shift to the right from volume loss. No lymphadenopathy. Trachea and mainstem bronchi appear patent. The esophagus is unremarkable. The thyroid gland is not enlarged. 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 No pneumothorax. Subpleural areas of scarring at the lung bases. No dominant mass. Minimal scarring at the left lung apex. Musculoskeletal: Remote right-sided rib fractures. No acute nor suspicious osseous lesions. CT ABDOMEN PELVIS FINDINGS Hepatobiliary: No focal liver abnormality is seen. No gallstones, gallbladder wall thickening, or biliary dilatation. Pancreas: Unremarkable. No pancreatic ductal dilatation or surrounding inflammatory changes. Spleen: Normal in size without focal abnormality. Adrenals/Urinary Tract: Normal bilateral adrenal glands. Marked bilateral hydroureteronephrosis extending to the urinary bladder where it is circumferentially thick-walled in appearance up to 12 mm. The urinary bladder is decompressed by an indwelling Foley catheter. A discrete mass or calculus is not identified however. Stomach/Bowel: The stomach is physiologically distended. Normal small bowel rotation without obstruction or inflammation. Distal and terminal ileum are unremarkable. Normal appearing appendix is visualized. No bowel obstruction or inflammation. Vascular/Lymphatic: Aortoiliac atherosclerosis. No aneurysm. No adenopathy. Reproductive: Within normal limits appearance of the prostate gland. Seminal vesicles are unremarkable. Other: Fat containing inguinal hernia on the left. No abdominopelvic ascites. Musculoskeletal: No aggressive osseous lesions. 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 IMPRESSION: 1. 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 2. 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 Recommend annual imaging followup by CTA or MRA. This recommendation follows 2010 ACCF/AHA/AATS/ACR/ASA/SCA/SCAI/SIR/STS/SVM Guidelines for the Diagnosis and Management of Patients with Thoracic Aortic  Disease. 2010; 121: C376-E831. 3. Marked bilateral hydroureteronephrosis extending to the urinary bladder where it is circumferentially thick-walled up to 12 mm. The urinary bladder is decompressed by indwelling Foley catheter. A discrete mass or calculus is not identified however. Findings may be due to mural thickening possibly from chronic cystitis. 4. Fat containing left inguinal hernia. 5. 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 Aortic Atherosclerosis (ICD10-I70.0). Electronically Signed   By: Ashley Royalty M.D.   On: 09/15/2018 23:20   US Renal  Result Date: 09/16/2018 CLINICAL DATA:  Renal failure EXAM: RENAL / URINARY TRACT ULTRASOUND COMPLETE COMPARISON:  CT abdomen 09/15/2018 FINDINGS: Right Kidney: Renal measurements: 30.4 x 6.3 x 5 cm = volume: 223 mL. Mild increased renal cortical echogenicity. No renal mass. Mild dilatation of right renal pelvis without significant calyceal dilatation. Left Kidney: Renal measurements: 12.4 x 5.7 x 5.6 cm = volume: 212 mL. Mild increased renal cortical echogenicity. No renal mass. Moderate dilatation of right renal pelvis without significant calyceal dilatation. Bladder: Irregular thickened bladder wall with a Foley catheter present. Prevoid bladder volume 51.8 mL. IMPRESSION: 1. Mild right and moderate left renal pelvis dilatation without  significant calyceal dilatation. 2. Mild increased renal cortical echogenicity bilaterally as can be seen with medical renal disease versus intrinsic bladder wall abnormality. 3. Severely thickened bladder wall as can be seen with bladder outlet obstruction. Electronically Signed   By: Kathreen Devoid   On: 09/16/2018 08:57   Dg Chest Port 1 View  Result Date: 09/15/2018 CLINICAL DATA:  Fever. EXAM: PORTABLE CHEST 1 VIEW COMPARISON:  None. FINDINGS: Cardiomediastinal silhouette is normal. Trachea shifted to the RIGHT with RIGHT apical pleural thickening. Bilateral calcified pleural plaques and RIGHT lung pleural thickening.  No pleural effusion or focal consolidation. Small potential pneumothorax. Soft tissue planes and included osseous structures are non suspicious. IMPRESSION: 1. Small potential RIGHT pneumothorax. Calcified pleural plaques which may obscure underlying acute process. RIGHT apical pleural thickening and scarring. 2. Acute findings discussed with and reconfirmed by Dr.JAMES MCSHANE on 09/15/2018 at 10:19 pm. Electronically Signed   By: Elon Alas M.D.   On: 09/15/2018 22:22   Ct Renal Stone Study  Result Date: 09/15/2018 CLINICAL DATA:  Weakness for the past week with fever. Patient has apparently been laying in his own urine and feces for on certain length of time. EXAM: CT CHEST, ABDOMEN AND PELVIS WITHOUT CONTRAST TECHNIQUE: Multidetector CT imaging of the chest, abdomen and pelvis was performed following the standard protocol without IV contrast. COMPARISON:  None. FINDINGS: CT CHEST FINDINGS Cardiovascular: Conventional branch pattern of the great vessels. Atherosclerosis is noted at the origin of the left subclavian artery. 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 Normal heart size without pericardial effusion. Left main and three-vessel coronary arteriosclerosis predominantly along the left main and LAD. The unenhanced pulmonary vasculature is unremarkable. Mediastinum/Nodes: There is mediastinal shift to the right from volume loss. No lymphadenopathy. Trachea and mainstem bronchi appear patent. The esophagus is unremarkable. The thyroid gland is not enlarged. 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 No pneumothorax. Subpleural areas of scarring at the lung bases. No dominant mass. Minimal scarring at the left lung apex. Musculoskeletal: Remote right-sided rib fractures. No acute nor suspicious osseous lesions. CT ABDOMEN PELVIS FINDINGS Hepatobiliary: No focal liver  abnormality is seen. No gallstones, gallbladder wall thickening, or biliary dilatation. Pancreas: Unremarkable. No pancreatic ductal dilatation or surrounding inflammatory changes. Spleen: Normal in size without focal abnormality. Adrenals/Urinary Tract: Normal bilateral adrenal glands. Marked bilateral hydroureteronephrosis extending to the urinary bladder where it is circumferentially thick-walled in appearance up to 12 mm. The urinary bladder is decompressed by an indwelling Foley catheter. A discrete mass or calculus is not identified however. Stomach/Bowel: The stomach is physiologically distended. Normal small bowel rotation without obstruction or inflammation. Distal and terminal ileum are unremarkable. Normal appearing appendix is visualized. No bowel obstruction or inflammation. Vascular/Lymphatic: Aortoiliac atherosclerosis. No aneurysm. No adenopathy. Reproductive: Within normal limits appearance of the prostate gland. Seminal vesicles are unremarkable. Other: Fat containing inguinal hernia on the left. No abdominopelvic ascites. Musculoskeletal: No aggressive osseous lesions. 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 IMPRESSION: 1. 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 2. 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 Recommend annual imaging followup by CTA or MRA. This recommendation follows 2010 ACCF/AHA/AATS/ACR/ASA/SCA/SCAI/SIR/STS/SVM Guidelines for the Diagnosis and Management of Patients with Thoracic Aortic Disease. 2010; 121: [id number]. 3. Marked bilateral hydroureteronephrosis extending to the urinary bladder where it is circumferentially thick-walled up to 12 mm. The  urinary bladder is decompressed by indwelling Foley catheter. A discrete mass or calculus is not identified however. Findings may be due to mural thickening possibly from chronic cystitis. 4. Fat containing left inguinal hernia. 5. Pars  defects at L5 without spondylolisthesis. Aortic Atherosclerosis (ICD10-I70.0). Electronically Signed   By: Ashley Royalty M.D.   On: 09/15/2018 23:20   [place] Ekg Site Rite  Result Date: 09/16/2018 If Site Rite image not attached, placement could not be confirmed due to current cardiac rhythm.   Consults: Treatment Team:  Anthonette Legato, MD Billey Co, MD   Subjective:    Overnight Issues: Patient has done well on CRRT.  Stabilization of blood pressures, has been weaned off of pressors  Objective:  Vital signs for last 24 hours: Temp:  [95.4 F (35.2 C)-97.9 F (36.6 C)] 97 F (36.1 C) (12/08 0530) Pulse Rate:  [74-99] 74 (12/08 0530) Resp:  [3-37] 18 (12/08 0530) BP: (80-119)/(56-80) 119/59 (12/08 0530) SpO2:  [94 %-100 %] 97 % (12/08 0530) Weight:  [83.6 kg] 83.6 kg (12/08 0425)  Hemodynamic parameters for last 24 hours:    Intake/Output from previous day: 12/07 0701 - 12/08 0700 In: 4781.5 [P.O.:240; I.V.:3061.9; IV Piggyback:1479.6] Out: 2135 [Urine:2135]  Intake/Output this shift: Total I/O In: 1091.2 [I.V.:991.2; IV Piggyback:100] Out: 5027 [Urine:1350]  Physical Exam:  General: Cachectic and disheveled HEENT: Sclera icteric, pupils equal and reactive, extraocular eye movements intact, trachea midline Neuro: Alert and oriented to person only, moves all extremities, follows some basic commands Cardiovascular: Apical pulse tachycardic, regular, S1-S2, no murmur regurg or gallop, +2 pulses bilaterally, no edema Lungs: Lateral breath sounds without any wheezes or rhonchi, diminished in the bases Abdomen: Nondistended, normal bowel sounds, palpation reveals diffuse tenderness Musculoskeletal: No joint deformities, positive range of motion, no clubbing, cyanosis or edema noted  Assessment/Plan:   Acute renal failure.  Has been seen by both urology and nephrology only on CRRT.  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  This  morning's labs reveal a BUN of 32/creatinine 2.14, potassium 3.9, CO2 of 24 with an anion gap of 6  Septic shock.  Patient with a 64.6 thousand white count.  Presently on meropenem.  Has weaned off of pressors, on stress dose hydrocortisone, pending all culture results.  Has grown E. coli in the blood  Troponin 1.08.  Trending down. Most likely reflect supply demand ischemia  Anemia.  No evidence of active bleeding  Hermelinda Dellen, DO  Layken Beg 09/17/2018  *Care during the described time interval was provided by me and/or other providers on the critical care team.  I have reviewed this patient's available data, including medical history, events of note, physical examination and test results as part of my evaluation.

## 2018-09-18 ENCOUNTER — Telehealth: Payer: Self-pay | Admitting: Urology

## 2018-09-18 LAB — GLUCOSE, CAPILLARY: Glucose-Capillary: 273 mg/dL — ABNORMAL HIGH (ref 70–99)

## 2018-09-18 LAB — BASIC METABOLIC PANEL
Anion gap: 6 (ref 5–15)
BUN: 38 mg/dL — ABNORMAL HIGH (ref 8–23)
CO2: 24 mmol/L (ref 22–32)
Calcium: 6.9 mg/dL — ABNORMAL LOW (ref 8.9–10.3)
Chloride: 110 mmol/L (ref 98–111)
Creatinine, Ser: 2.02 mg/dL — ABNORMAL HIGH (ref 0.61–1.24)
GFR calc Af Amer: 37 mL/min — ABNORMAL LOW (ref >60)
GFR calc non Af Amer: 32 mL/min — ABNORMAL LOW (ref >60)
Glucose, Bld: 151 mg/dL — ABNORMAL HIGH (ref 70–99)
Potassium: 3.6 mmol/L (ref 3.5–5.1)
SODIUM: 140 mmol/L (ref 135–145)

## 2018-09-18 LAB — CULTURE, BLOOD (ROUTINE X 2): Special Requests: ADEQUATE

## 2018-09-18 LAB — MAGNESIUM: Magnesium: 2.2 mg/dL (ref 1.7–2.4)

## 2018-09-18 LAB — URINE CULTURE: Culture: >=100000 — AB

## 2018-09-18 LAB — CBC
HCT: 25.6 % — ABNORMAL LOW (ref 39.0–52.0)
Hemoglobin: 8.7 g/dL — ABNORMAL LOW (ref 13.0–17.0)
MCH: 29.5 pg (ref 26.0–34.0)
MCHC: 34 g/dL (ref 30.0–36.0)
MCV: 86.8 fL (ref 80.0–100.0)
NRBC: 0 % (ref 0.0–0.2)
Platelets: 150 10*3/uL (ref 150–400)
RBC: 2.95 MIL/uL — ABNORMAL LOW (ref 4.22–5.81)
RDW: 14.8 % (ref 11.5–15.5)
WBC: 44.6 10*3/uL — ABNORMAL HIGH (ref 4.0–10.5)

## 2018-09-18 LAB — PHOSPHORUS: Phosphorus: 3.4 mg/dL (ref 2.5–4.6)

## 2018-09-18 MED ORDER — CEPHALEXIN 500 MG PO CAPS
500.0000 mg | ORAL_CAPSULE | Freq: Two times a day (BID) | ORAL | Status: DC
  Administered 2018-09-18 – 2018-09-19 (×2): 500 mg via ORAL
  Filled 2018-09-18 (×2): qty 1

## 2018-09-18 MED ORDER — SODIUM CHLORIDE 0.9 % IV SOLN
2.0000 g | INTRAVENOUS | Status: DC
  Administered 2018-09-18: 2 g via INTRAVENOUS
  Filled 2018-09-18: qty 2

## 2018-09-18 NOTE — Progress Notes (Signed)
[facility]  ROUNDING NOTE   Subjective:   UOP 1077mL  Foley removed this morning.   Objective:  Vital signs in last 24 hours:  Temp:  [96.8 F (36 C)-97.9 F (36.6 C)] 97.2 F (36.2 C) (12/09 0800) Pulse Rate:  [71-118] 88 (12/09 0800) Resp:  [7-27] 19 (12/09 0800) BP: (89-129)/(57-100) 124/75 (12/09 0754) SpO2:  [93 %-100 %] 99 % (12/09 0800) Weight:  [85.9 kg] 85.9 kg (12/09 0500)  Weight change: 2.3 kg Filed Weights   09/15/18 2124 09/17/18 0425 09/18/18 0500  Weight: 74.8 kg 83.6 kg 85.9 kg    Intake/Output: I/O last 3 completed shifts: In: 5631.6 [P.O.:360; I.V.:4890.7; IV Piggyback:380.8] Out: 3300 [Urine:3300]   Intake/Output this shift:  Total I/O In: 667.9 [I.V.:667.9] Out: 225 [Urine:225]  Physical Exam: General: NAD,   Head: Normocephalic, atraumatic. Moist oral mucosal membranes  Eyes: Anicteric, PERRL  Neck: Supple, trachea midline  Lungs:  Clear to auscultation  Heart: Regular rate and rhythm  Abdomen:  Soft, nontender, +distended  Extremities:  1+ peripheral edema.  Neurologic: Nonfocal, moving all four extremities  Skin: No lesions  Access: Right femoral temp HD catheter    Basic Metabolic Panel: Recent Labs  Lab 09/17/18 0310 09/17/18 0637 09/17/18 1040 09/17/18 2105 09/18/18 0616  NA 139 138 139 139 140  K 3.9 3.9 3.6 3.7 3.6  CL 109 107 109 108 110  CO2 24 24 25 24 24   GLUCOSE 126* 128* 181* 140* 151*  BUN 32* 31* 28* 33* 38*  CREATININE 2.14* 2.04* 1.64* 2.03* 2.02*  CALCIUM 7.2* 7.2* 7.3* 7.2* 6.9*  MG 2.1 2.0 2.0 2.0 2.2  PHOS 2.5 2.7 2.3* 2.7 3.4    Liver Function Tests: Recent Labs  Lab 09/15/18 2129 09/16/18 0055  09/16/18 2241 09/17/18 0310 09/17/18 0637 09/17/18 1040 09/17/18 2105  AST 84* 90*  --   --   --   --   --  43*  ALT 66* 63*  --   --   --   --   --  43  ALKPHOS 488* 392*  --   --   --   --   --  238*  BILITOT 2.1* 2.1*  --   --   --   --   --  0.6  PROT 6.6 5.5*  --   --   --   --    --  5.3*  ALBUMIN 2.3* 1.9*   < > 1.8* 1.7* 1.6* 1.7* 1.8*   < > = values in this interval not displayed.   No results for input(s): LIPASE, AMYLASE in the last 168 hours. No results for input(s): AMMONIA in the last 168 hours.  CBC: Recent Labs  Lab 09/15/18 2129 09/16/18 0507 09/17/18 0310 09/17/18 2105 09/18/18 0616  WBC 10.5 58.6* 64.6* 59.2* 44.6*  NEUTROABS 9.7*  --   --   --   --   HGB 12.0* 9.4* 8.8* 9.4* 8.7*  HCT 36.3* 27.4* 25.7* 27.3* 25.6*  MCV 87.9 87.0 85.4 86.1 86.8  PLT 248 227 165 162 150    Cardiac Enzymes: Recent Labs  Lab 09/15/18 2129 09/16/18 0055 09/16/18 0507 09/16/18 1120 09/16/18 1521 09/17/18 0310  CKTOTAL 26*  --   --   --   --   --   TROPONINI 0.18* 1.33* 1.82* 1.84* 1.23* 1.08*    BNP: Invalid input(s): POCBNP  CBG: Recent Labs  Lab 09/16/18 0018 09/16/18 0945 09/17/18 0730  GLUCAP 112* 133* 127*  Microbiology: Results for orders placed or performed during the hospital encounter of 09/15/18  Blood Culture (routine x 2)     Status: Abnormal   Collection Time: 09/15/18  9:29 PM  Result Value Ref Range Status   Specimen Description   Final    BLOOD RIGHT ARM Performed at [facility], [address] [place], [place] [address]    Special Requests   Final    BOTTLES DRAWN AEROBIC AND ANAEROBIC Blood Culture results may not be optimal due to an excessive volume of blood received in culture bottles Performed at [facility], [address] [place], [place] [address]    Culture  Setup Time   Final    IN BOTH AEROBIC AND ANAEROBIC BOTTLES GRAM NEGATIVE RODS CRITICAL VALUE NOTED.  VALUE IS CONSISTENT WITH PREVIOUSLY REPORTED AND CALLED VALUE. Performed at [facility], [address] [place], [place] [address]    Culture (A)  Final    ESCHERICHIA COLI SUSCEPTIBILITIES PERFORMED ON PREVIOUS CULTURE WITHIN THE LAST 5 DAYS. Performed at [facility], [place] [address] [place], [place]  [address]    Report Status 09/18/2018 FINAL  Final  Blood Culture (routine x 2)     Status: Abnormal   Collection Time: 09/15/18  9:29 PM  Result Value Ref Range Status   Specimen Description   Final    BLOOD LEFT ARM Performed at [facility], [address], [place], [place] [address]    Special Requests   Final    BOTTLES DRAWN AEROBIC AND ANAEROBIC Blood Culture adequate volume Performed at [facility], [address] [place], [place] [address]    Culture  Setup Time   Final    IN BOTH AEROBIC AND ANAEROBIC BOTTLES GRAM NEGATIVE RODS CRITICAL RESULT CALLED TO, READ BACK BY AND VERIFIED WITH: MELISSA Rodriguez Camp AT 1059 ON 09/16/18 BY SNJ Performed at [facility], [place] [address] [place], [place] [address]    Culture ESCHERICHIA COLI (A)  Final   Report Status 09/18/2018 FINAL  Final   Organism ID, Bacteria ESCHERICHIA COLI  Final      Susceptibility   Escherichia coli - MIC*    AMPICILLIN <=2 SENSITIVE Sensitive     CEFAZOLIN <=4 SENSITIVE Sensitive     CEFEPIME <=1 SENSITIVE Sensitive     CEFTAZIDIME <=1 SENSITIVE Sensitive     CEFTRIAXONE <=1 SENSITIVE Sensitive     CIPROFLOXACIN <=0.25 SENSITIVE Sensitive     GENTAMICIN <=1 SENSITIVE Sensitive     IMIPENEM <=0.25 SENSITIVE Sensitive     TRIMETH/SULFA <=20 SENSITIVE Sensitive     AMPICILLIN/SULBACTAM <=2 SENSITIVE Sensitive     PIP/TAZO <=4 SENSITIVE Sensitive     Extended ESBL NEGATIVE Sensitive     * ESCHERICHIA COLI  Blood Culture ID Panel (Reflexed)     Status: Abnormal   Collection Time: 09/15/18  9:29 PM  Result Value Ref Range Status   Enterococcus species NOT DETECTED NOT DETECTED Final   Listeria monocytogenes NOT DETECTED NOT DETECTED Final   Staphylococcus species NOT DETECTED NOT DETECTED Final   Staphylococcus aureus (BCID) NOT DETECTED NOT DETECTED Final   Streptococcus species NOT DETECTED NOT DETECTED Final   Streptococcus agalactiae NOT DETECTED NOT DETECTED Final    Streptococcus pneumoniae NOT DETECTED NOT DETECTED Final   Streptococcus pyogenes NOT DETECTED NOT DETECTED Final   Acinetobacter baumannii NOT DETECTED NOT DETECTED Final   Enterobacteriaceae species DETECTED (A) NOT DETECTED Final    Comment:  Enterobacteriaceae represent a large family of gram-negative bacteria, not a single organism. MELISSA MACCIA AT 9678 ON 09/16/18 BY SNJ    Enterobacter cloacae complex NOT DETECTED NOT DETECTED Final   Escherichia coli DETECTED (A) NOT DETECTED Final    Comment: CRITICAL RESULT CALLED TO, READ BACK BY AND VERIFIED WITH: MELISSA MACCIA AT 02-01-1058 ON 09/16/18 BY SNJ    Klebsiella oxytoca NOT DETECTED NOT DETECTED Final   Klebsiella pneumoniae NOT DETECTED NOT DETECTED Final   Proteus species NOT DETECTED NOT DETECTED Final   Serratia marcescens NOT DETECTED NOT DETECTED Final   Carbapenem resistance NOT DETECTED NOT DETECTED Final   Haemophilus influenzae NOT DETECTED NOT DETECTED Final   Neisseria meningitidis NOT DETECTED NOT DETECTED Final   Pseudomonas aeruginosa NOT DETECTED NOT DETECTED Final   Candida albicans NOT DETECTED NOT DETECTED Final   Candida glabrata NOT DETECTED NOT DETECTED Final   Candida krusei NOT DETECTED NOT DETECTED Final   Candida parapsilosis NOT DETECTED NOT DETECTED Final   Candida tropicalis NOT DETECTED NOT DETECTED Final    Comment: Performed at [facility], [address] [place], [place] [address]  Urine culture     Status: Abnormal   Collection Time: 09/15/18 10:02 PM  Result Value Ref Range Status   Specimen Description   Final    URINE, RANDOM Performed at [facility], [address] [place], [place] [address]    Special Requests   Final    NONE Performed at [facility], [address] [place], [place] [address]    Culture >=100,000 COLONIES/mL ESCHERICHIA COLI (A)  Final   Report Status 09/18/2018 FINAL  Final   Organism ID, Bacteria ESCHERICHIA COLI (A)  Final       Susceptibility   Escherichia coli - MIC*    AMPICILLIN <=2 SENSITIVE Sensitive     CEFAZOLIN <=4 SENSITIVE Sensitive     CEFTRIAXONE <=1 SENSITIVE Sensitive     CIPROFLOXACIN <=0.25 SENSITIVE Sensitive     GENTAMICIN <=1 SENSITIVE Sensitive     IMIPENEM <=0.25 SENSITIVE Sensitive     NITROFURANTOIN <=16 SENSITIVE Sensitive     TRIMETH/SULFA <=20 SENSITIVE Sensitive     AMPICILLIN/SULBACTAM <=2 SENSITIVE Sensitive     PIP/TAZO <=4 SENSITIVE Sensitive     Extended ESBL NEGATIVE Sensitive     * >=100,000 COLONIES/mL ESCHERICHIA COLI  MRSA PCR Screening     Status: None   Collection Time: 09/16/18 12:14 AM  Result Value Ref Range Status   MRSA by PCR NEGATIVE NEGATIVE Final    Comment:        The GeneXpert MRSA Assay (FDA approved for NASAL specimens only), is one component of a comprehensive MRSA colonization surveillance program. It is not intended to diagnose MRSA infection nor to guide or monitor treatment for MRSA infections. Performed at [facility], [address] [place], [place] [address]     Coagulation Studies: Recent Labs    09/15/18 02-Feb-2128  LABPROT 17.2*  INR 1.42    Urinalysis: Recent Labs    09/15/18 02-01-2201  COLORURINE YELLOW*  LABSPEC 1.005  PHURINE 6.0  GLUCOSEU NEGATIVE  HGBUR MODERATE*  BILIRUBINUR NEGATIVE  KETONESUR NEGATIVE  PROTEINUR 30*  NITRITE NEGATIVE  LEUKOCYTESUR LARGE*      Imaging: No results found.   Medications:   . sodium chloride 150 mL/hr at 09/18/18 0755  . cefTRIAXone (ROCEPHIN)  IV 2 g (09/18/18 0958)  . pureflow 1 mL (09/17/18 1029)   . docusate sodium  100 mg Oral BID  . fentaNYL (SUBLIMAZE) injection  25 mcg Intravenous Once  . heparin  5,000 Units Subcutaneous Q8H  . lidocaine  10 mL Infiltration Once  . midazolam  1 mg Intravenous Once  . tamsulosin  0.4 mg Oral QPC supper   acetaminophen **OR** acetaminophen, bisacodyl, guaiFENesin **AND** dextromethorphan, heparin, HYDROcodone-acetaminophen,  ondansetron **OR** ondansetron (ZOFRAN) IV, traZODone  Assessment/ Plan:  Samuel Pitts is a 72 y.o. white male with history of skin cancer, who was admitted to [facility] on12/03/2018   1. Acute renal failure secondary to hydronephrosis/ obstructive uropathy. Suspect underlying chronic kidney disease, NOS No baseline creatinine.  Required CRRT on admission. Stopped on 12/8 - No indication for renal replacement therapy at this time.  - Keep temp HD catheter for one more day.  - Decrease IV fluids to NS at 21mL/hr - Foley removed this morning.   2. Hypotension/sepsis: E. Coli - leukocytosis trending down.  - empiric meropenem  3. Anemia: with renal failure. Hemoglobin 8.7   LOS: 3 Shoaib Siefker 12/9/201910:10 AM

## 2018-09-18 NOTE — Progress Notes (Signed)
Pharmacy Electrolyte Monitoring Consult:  Pharmacy consulted to assist in monitoring and replacing electrolytes in this 72 y.o. male admitted on 09/15/2018 with Weakness   Labs:  Sodium (mmol/L)  Date Value  09/18/2018 140   Potassium (mmol/L)  Date Value  09/18/2018 3.6   Magnesium (mg/dL)  Date Value  09/18/2018 2.2   Phosphorus (mg/dL)  Date Value  09/18/2018 3.4   Calcium (mg/dL)  Date Value  09/18/2018 6.9 (L)   Albumin (g/dL)  Date Value  09/17/2018 1.8 (L)   Corrected Calcium: 8.66   Assessment/Plan: On CRRT  K 3.6, Mag 2.2, Phos 3.4 - no supplementation needed at this time   Will recheck labs in [place], PharmD, BCPS Clinical Pharmacist 09/18/2018 7:40 AM

## 2018-09-18 NOTE — Telephone Encounter (Signed)
App made   Tried to give the app to the nurse Lexi in ICU but she would not take it. So I mailed it to the patient.  Sharyn Lull

## 2018-09-18 NOTE — Progress Notes (Addendum)
[facility] [place] NAME: Samuel Pitts    MR#:  [id number]  DATE OF BIRTH:  02-04-1946  SUBJECTIVE:  Doing ok this am  REVIEW OF SYSTEMS:    Review of Systems  Constitutional: Positive for malaise/fatigue. Negative for chills and fever.  HENT: Negative.  Negative for ear discharge, ear pain, hearing loss, nosebleeds and sore throat.   Eyes: Negative.  Negative for blurred vision and pain.  Respiratory: Negative.  Negative for cough, hemoptysis, shortness of breath and wheezing.   Cardiovascular: Negative.  Negative for chest pain, palpitations and leg swelling.  Gastrointestinal: Negative.  Negative for abdominal pain, blood in stool, diarrhea, nausea and vomiting.  Genitourinary: Negative.  Negative for dysuria.  Musculoskeletal: Negative.  Negative for back pain.  Skin: Negative.   Neurological: Negative for dizziness, tremors, speech change, focal weakness, seizures and headaches.  Endo/Heme/Allergies: Negative.  Does not bruise/bleed easily.  Psychiatric/Behavioral: Negative.  Negative for depression, hallucinations and suicidal ideas.     Tolerating Diet: yes      DRUG ALLERGIES:   Allergies  Allergen Reactions  . Omeprazole     SEVERE STOMACH PAIN    VITALS:  Blood pressure 132/84, pulse 90, temperature 97.6 F (36.4 C), temperature source Oral, resp. rate 18, height 6' (1.829 m), weight 85.9 kg, SpO2 98 %.  PHYSICAL EXAMINATION:  Constitutional: Appears well-developed and well-nourished. No distress. HENT: Normocephalic. Marland Kitchen Oropharynx is clear and moist.  Eyes: Conjunctivae are normal. PERRLA, no scleral icterus.  Neck: No JVD. No tracheal deviation. CVS: RRR, S1/S2 +, no murmurs, no gallops, no carotid bruit.  Pulmonary: Effort and breath sounds normal, no stridor, rhonchi, wheezes, rales.  Abdominal: Soft. BS +,  no distension, tenderness, rebound or guarding.  Musculoskeletal: Normal range of motion. No  edema and no tenderness.  Neuro: awake alert psychiatric: awake    LABORATORY PANEL:   CBC Recent Labs  Lab 09/18/18 0616  WBC 44.6*  HGB 8.7*  HCT 25.6*  PLT 150   ------------------------------------------------------------------------------------------------------------------  Chemistries  Recent Labs  Lab 09/17/18 2105 09/18/18 0616  NA 139 140  K 3.7 3.6  CL 108 110  CO2 24 24  GLUCOSE 140* 151*  BUN 33* 38*  CREATININE 2.03* 2.02*  CALCIUM 7.2* 6.9*  MG 2.0 2.2  AST 43*  --   ALT 43  --   ALKPHOS 238*  --   BILITOT 0.6  --    ------------------------------------------------------------------------------------------------------------------  Cardiac Enzymes Recent Labs  Lab 09/16/18 1120 09/16/18 1521 09/17/18 0310  TROPONINI 1.84* 1.23* 1.08*   ------------------------------------------------------------------------------------------------------------------  RADIOLOGY:  No results found.   ASSESSMENT AND PLAN:   72 year old male with a history of skin cancer who presented to the emergency room due to generalized weakness.  1.  Septic shock from UTI with bilateral hydro-ureter nephrosis: Patient presented with fever, hypotensive tachycardia and leukocytosis. Patient is off of pressors  Change to oral Keflex oer sensitivities of UTI Treat for 2 weeks due to bacteremia Septich shock resolved.   Elevated white blood cell count is due to Solu-Cortef which has now been discontinued.   2.  Bilateral hydroureter nephrosis: Patient evaluated by urology. Maintain Foley catheter for postobstructive diuresis He will need a Foley 10 to 14 days and outpatient voiding trial Continue Flomax   3.  Acute renal failure due to hydronephrosis: Patient did receive CRRT but has been off of this since yesterday.  Creatinine is responding well as well as urine output.  4.  Hypokalemia: Resolved  5.  Elevated troponin in the setting of poor renal  clearance and demand ischemia with septic shock  6.  Acute metabolic encephalopathy due to septic shock and acute renal failure: Patient is at baseline  7.  Elevated liver function tests in the setting of septic shock: Repeat in a.m.Continue to monitor   CODE STATUS: full  TOTAL TIME TAKING CARE OF THIS PATIENT: 75minutes.     POSSIBLE D/Ctomorrow, DEPENDING ON CLINICAL CONDITION.   Edris Friedt M.D on 09/18/2018 at 12:44 PM  Between 7am to 6pm - Pager - [phone] After 6pm go to [email or web address] - password [email or web address] [facility]  Office  [phone]  CC: Primary care physician; Patient, No Pcp Per  Note: This dictation was prepared with Dragon dictation along with smaller phrase technology. Any transcriptional errors that result from this process are unintentional.

## 2018-09-18 NOTE — Evaluation (Addendum)
Physical Therapy Evaluation Patient Details Name: Samuel Pitts MRN: [id number] DOB: 11/21/1945 Today's Date: 09/18/2018   History of Present Illness  presented to ER secondary to AMS, lethargy; admitted with sepsis, metabolic encephalopathy related to UTI with bilat hydroureteronephrosis  Clinical Impression  Upon evaluation, patient alert and oriented; follows commands and demonstrates fair/good insight into deficits.  Eager for OOB as tolerated and when medically appropriate.  Patient initiated transition towards edge of bed with min assist from therapist; however, R temp fem cath noted during transition and patient immediately returned to supine with R LE positioned to neutral.  No signs of line displacement or bleeding noted.  RN informed/aware and to monitor post-session. Bilat UE/L LE strength and ROM grossly WFL (at least 4+ to 5/5); R LE ROM/MMT deferred secondary to R temp fem cath.  Anticipate good functional mobility once patient medically appropriate to progress. Would benefit from skilled PT to address above deficits and promote optimal return to PLOF;  Will continue mobility assessment next session (once line discontinued).  Will make firm recommendations once mobility assessment complete (per note, line scheduled to be removed next date).    Follow Up Recommendations (will establish firm discharge recommendations once mobility assessment fully completed next date)    Equipment Recommendations       Recommendations for Other Services       Precautions / Restrictions Precautions Precautions: Fall Restrictions Weight Bearing Restrictions: No      Mobility  Bed Mobility Overal bed mobility: Needs Assistance Bed Mobility: Supine to Sit     Supine to sit: Min assist     General bed mobility comments: performed partial transfer towards edge of bed when noted temp dialysis catheter to R LE.  Immediately returned to bed with R LE positioned to neutral.  Line intact,  no signs of bleeding noted.  RN informed/aware and to monitor.  Transfers                 General transfer comment: Deferred secondary to R temp fem dialysis catheter  Ambulation/Gait             General Gait Details: Deferred secondary to R temp fem dialysis catheter  Stairs            Wheelchair Mobility    Modified Rankin (Stroke Patients Only)       Balance                                             Pertinent Vitals/Pain Pain Assessment: No/denies pain    Home Living Family/patient expects to be discharged to:: Private residence Living Arrangements: Spouse/significant other;Children Available Help at Discharge: Family Type of Home: House Home Access: Ramped entrance     Home Layout: One level Home Equipment: None      Prior Function Level of Independence: Independent         Comments: Indep with ADLs, household and community mobilization without assist device; + driving; denies fall history.  Patient responsible for majority of household responsibilities (wife disabled) and assist wife with WC propulsion/mobility as needed (does not have to physically lift her between sufaces)     Hand Dominance   Dominant Hand: Right    Extremity/Trunk Assessment   Upper Extremity Assessment Upper Extremity Assessment: Overall WFL for tasks assessed    Lower Extremity Assessment Lower Extremity Assessment: Overall WFL for tasks  assessed(R LE at least 3-/5, L LE at least 4+ to 5/5)       Communication   Communication: No difficulties  Cognition Arousal/Alertness: Awake/alert Behavior During Therapy: WFL for tasks assessed/performed Overall Cognitive Status: Within Functional Limits for tasks assessed                                        General Comments      Exercises     Assessment/Plan    PT Assessment Patient needs continued PT services  PT Problem List Decreased range of motion;Decreased  strength;Decreased activity tolerance;Decreased balance;Decreased mobility;Decreased cognition;Decreased knowledge of use of DME;Decreased safety awareness       PT Treatment Interventions DME instruction;Gait training;Functional mobility training;Balance training;Therapeutic exercise;Patient/family education;Therapeutic activities    PT Goals (Current goals can be found in the Care Plan section)  Acute Rehab PT Goals Patient Stated Goal: to get up and going PT Goal Formulation: With patient Time For Goal Achievement: 10/02/18 Potential to Achieve Goals: Good    Frequency Min 2X/week   Barriers to discharge Decreased caregiver support      Co-evaluation               AM-PAC PT "6 Clicks" Mobility  Outcome Measure Help needed turning from your back to your side while in a flat bed without using bedrails?: A Little Help needed moving from lying on your back to sitting on the side of a flat bed without using bedrails?: A Little Help needed moving to and from a bed to a chair (including a wheelchair)?: Total Help needed standing up from a chair using your arms (e.g., wheelchair or bedside chair)?: Total Help needed to walk in hospital room?: Total Help needed climbing 3-5 steps with a railing? : Total 6 Click Score: 10    End of Session   Activity Tolerance: Treatment limited secondary to medical complications (Comment)(Deferred secondary to R temp fem dialysis catheter) Patient left: in bed;with call bell/phone within reach;with bed alarm set   PT Visit Diagnosis: Muscle weakness (generalized) (M62.81);Difficulty in walking, not elsewhere classified (R26.2)    Time: 1034-1050 PT Time Calculation (min) (ACUTE ONLY): 16 min   Charges:   PT Evaluation $PT Eval Low Complexity: 1 Low          Zaryia Markel H. Owens Shark, PT, DPT, NCS 09/18/18, 12:07 PM [phone]

## 2018-09-18 NOTE — Plan of Care (Signed)

## 2018-09-18 NOTE — Care Management Note (Signed)
Case Management Note  Patient Details  Name: Samuel Pitts MRN: [id number] Date of Birth: 15-Feb-1946  Subjective/Objective:   Admitted tro [facility] in septic shock from a urinary tract infection. Wife is Belenda Cruise [phone]). Seen a physician at [facility] in [place] 2 weeks ago. Dr. Amie Critchley? Prescriptions are filled at [facility] in [place]. No home health. No skilled facility. No medical equipment in the home. Takes care of all basic activities of daily living himself, drives. No falls. Good appetite. Independent prior to this admission                 Action/Plan: Will continue to follow transition of care needs.    Expected Discharge Date:                  Expected Discharge Plan:     In-House Referral:   yes  Discharge planning Services   yes  Post Acute Care Choice:    Choice offered to:     DME Arranged:    DME Agency:     HH Arranged:    HH Agency:     Status of Service:     If discussed at [employer] of Stay Meetings, dates discussed:    Additional Comments:  Shelbie Ammons, RN MSN CCM Care Management [phone] 09/18/2018, 12:46 PM

## 2018-09-18 NOTE — Progress Notes (Signed)
Pharmacy Antibiotic Note  Samuel Pitts is a 72 y.o. male admitted on 09/15/2018 with bacteremia and UTI.  Pharmacy has been consulted for Keflex dosing. Patient is currently on Ceftriaxone.  Plan: Discontinue Ceftriaxone, start Keflex 500mg  PO BID  Height: 6' (182.9 cm) Weight: 189 lb 6 oz (85.9 kg) IBW/kg (Calculated) : 77.6  Temp (24hrs), Avg:97.4 F (36.3 C), Min:96.8 F (36 C), Max:97.9 F (36.6 C)  Recent Labs  Lab 09/15/18 2123 09/15/18 2129 09/16/18 0053  09/16/18 0507 09/16/18 0512  09/17/18 0310 09/17/18 0637 09/17/18 1040 09/17/18 2105 09/18/18 0616  WBC  --  10.5  --   --  58.6*  --   --  64.6*  --   --  59.2* 44.6*  CREATININE  --  5.35*  --    < > 5.35*  --    < > 2.14* 2.04* 1.64* 2.03* 2.02*  LATICACIDVEN 6.19*  --  5.8*  --  3.2*  --   --   --   --   --   --   --   VANCORANDOM  --   --   --   --   --  13  --   --   --   --   --   --    < > = values in this interval not displayed.    Estimated Creatinine Clearance: 35.7 mL/min (A) (by C-G formula based on SCr of 2.02 mg/dL (H)).    Allergies  Allergen Reactions  . Omeprazole     SEVERE STOMACH PAIN    Antimicrobials this admission: Vanc 12/6 >> 12/7 Flagyl 12/6 >> 12/7 Meropenem 12/7 >> 12/9 Ceftriaxone 12/9 >> 12/9 Cephelexin 12/9 >>  Microbiology results: 12/6 BCx: E.coli (pan Sensitive) 12/6 UCx: E.coli (pan Sensitive)   Thank you for allowing pharmacy to be a part of this patient's care.  Paulina Fusi, PharmD, BCPS 09/18/2018 12:03 PM

## 2018-09-18 NOTE — Telephone Encounter (Signed)
-----   Message from Billey Co, MD sent at 09/16/2018  9:31 AM EST ----- Regarding: follow up Please arrange follow up with me in 3 weeks for voiding trial with PVR, and new patient 30 minute visit. OK to overbook.  Nickolas Madrid, MD 09/16/2018

## 2018-09-19 LAB — CBC
HCT: 27.2 % — ABNORMAL LOW (ref 39.0–52.0)
Hemoglobin: 9.1 g/dL — ABNORMAL LOW (ref 13.0–17.0)
MCH: 29.2 pg (ref 26.0–34.0)
MCHC: 33.5 g/dL (ref 30.0–36.0)
MCV: 87.2 fL (ref 80.0–100.0)
Platelets: 182 10*3/uL (ref 150–400)
RBC: 3.12 MIL/uL — ABNORMAL LOW (ref 4.22–5.81)
RDW: 15.2 % (ref 11.5–15.5)
WBC: 24.3 10*3/uL — ABNORMAL HIGH (ref 4.0–10.5)
nRBC: 0 % (ref 0.0–0.2)

## 2018-09-19 LAB — COMPREHENSIVE METABOLIC PANEL
ALT: 50 U/L — AB (ref 0–44)
AST: 48 U/L — AB (ref 15–41)
Albumin: 1.8 g/dL — ABNORMAL LOW (ref 3.5–5.0)
Alkaline Phosphatase: 221 U/L — ABNORMAL HIGH (ref 38–126)
Anion gap: 5 (ref 5–15)
BUN: 41 mg/dL — AB (ref 8–23)
CO2: 23 mmol/L (ref 22–32)
Calcium: 7.1 mg/dL — ABNORMAL LOW (ref 8.9–10.3)
Chloride: 115 mmol/L — ABNORMAL HIGH (ref 98–111)
Creatinine, Ser: 2.07 mg/dL — ABNORMAL HIGH (ref 0.61–1.24)
GFR calc Af Amer: 36 mL/min — ABNORMAL LOW (ref >60)
GFR calc non Af Amer: 31 mL/min — ABNORMAL LOW (ref >60)
Glucose, Bld: 89 mg/dL (ref 70–99)
Potassium: 3.2 mmol/L — ABNORMAL LOW (ref 3.5–5.1)
Sodium: 143 mmol/L (ref 135–145)
Total Bilirubin: 0.6 mg/dL (ref 0.3–1.2)
Total Protein: 5 g/dL — ABNORMAL LOW (ref 6.5–8.1)

## 2018-09-19 LAB — GLUCOSE, CAPILLARY
GLUCOSE-CAPILLARY: 69 mg/dL — AB (ref 70–99)
Glucose-Capillary: 78 mg/dL (ref 70–99)

## 2018-09-19 MED ORDER — TAMSULOSIN HCL 0.4 MG PO CAPS: 0.4000 mg | ORAL_CAPSULE | Freq: Every day | ORAL | 0 refills | Status: DC

## 2018-09-19 MED ORDER — CEPHALEXIN 500 MG PO CAPS: 500.0000 mg | ORAL_CAPSULE | Freq: Two times a day (BID) | ORAL | 0 refills | Status: AC

## 2018-09-19 MED ORDER — POTASSIUM CHLORIDE CRYS ER 20 MEQ PO TBCR
20.0000 meq | EXTENDED_RELEASE_TABLET | Freq: Once | ORAL | Status: AC
  Administered 2018-09-19: 20 meq via ORAL
  Filled 2018-09-19: qty 1

## 2018-09-19 NOTE — Progress Notes (Signed)
[facility]  ROUNDING NOTE   Subjective:   UOP 4575mL  HD catheter removed this morning   Objective:  Vital signs in last 24 hours:  Temp:  [97.8 F (36.6 C)-98.1 F (36.7 C)] 98.1 F (36.7 C) (12/10 1337) Pulse Rate:  [70-71] 70 (12/10 1337) Resp:  [18-20] 20 (12/10 1337) BP: (139-153)/(71-91) 153/82 (12/10 1337) SpO2:  [95 %-100 %] 98 % (12/10 1337) Weight:  [71.1 kg] 71.1 kg (12/10 0442)  Weight change: -14.8 kg Filed Weights   09/17/18 0425 09/18/18 0500 09/19/18 0442  Weight: 83.6 kg 85.9 kg 71.1 kg    Intake/Output: I/O last 3 completed shifts: In: 3915.1 [P.O.:240; I.V.:3584.8; IV Piggyback:90.3] Out: 8338 [Urine:5525]   Intake/Output this shift:  Total I/O In: -  Out: 2600 [Urine:2600]  Physical Exam: General: NAD,   Head: Normocephalic, atraumatic. Moist oral mucosal membranes  Eyes: Anicteric, PERRL  Neck: Supple, trachea midline  Lungs:  Clear to auscultation  Heart: Regular rate and rhythm  Abdomen:  Soft, nontender, +distended  Extremities: no peripheral edema.  Neurologic: Nonfocal, moving all four extremities  Skin: No lesions  Access: none    Basic Metabolic Panel: Recent Labs  Lab 09/17/18 0310 09/17/18 0637 09/17/18 1040 09/17/18 2105 09/18/18 0616 09/19/18 0347  NA 139 138 139 139 140 143  K 3.9 3.9 3.6 3.7 3.6 3.2*  CL 109 107 109 108 110 115*  CO2 24 24 25 24 24 23   GLUCOSE 126* 128* 181* 140* 151* 89  BUN 32* 31* 28* 33* 38* 41*  CREATININE 2.14* 2.04* 1.64* 2.03* 2.02* 2.07*  CALCIUM 7.2* 7.2* 7.3* 7.2* 6.9* 7.1*  MG 2.1 2.0 2.0 2.0 2.2  --   PHOS 2.5 2.7 2.3* 2.7 3.4  --     Liver Function Tests: Recent Labs  Lab 09/15/18 2129 09/16/18 0055  09/17/18 0310 09/17/18 0637 09/17/18 1040 09/17/18 2105 09/19/18 0347  AST 84* 90*  --   --   --   --  43* 48*  ALT 66* 63*  --   --   --   --  43 50*  ALKPHOS 488* 392*  --   --   --   --  238* 221*  BILITOT 2.1* 2.1*  --   --   --   --  0.6 0.6  PROT 6.6  5.5*  --   --   --   --  5.3* 5.0*  ALBUMIN 2.3* 1.9*   < > 1.7* 1.6* 1.7* 1.8* 1.8*   < > = values in this interval not displayed.   No results for input(s): LIPASE, AMYLASE in the last 168 hours. No results for input(s): AMMONIA in the last 168 hours.  CBC: Recent Labs  Lab 09/15/18 2129 09/16/18 0507 09/17/18 0310 09/17/18 2105 09/18/18 0616 09/19/18 0347  WBC 10.5 58.6* 64.6* 59.2* 44.6* 24.3*  NEUTROABS 9.7*  --   --   --   --   --   HGB 12.0* 9.4* 8.8* 9.4* 8.7* 9.1*  HCT 36.3* 27.4* 25.7* 27.3* 25.6* 27.2*  MCV 87.9 87.0 85.4 86.1 86.8 87.2  PLT 248 227 165 162 150 182    Cardiac Enzymes: Recent Labs  Lab 09/15/18 2129 09/16/18 0055 09/16/18 0507 09/16/18 1120 09/16/18 1521 09/17/18 0310  CKTOTAL 26*  --   --   --   --   --   TROPONINI 0.18* 1.33* 1.82* 1.84* 1.23* 1.08*    BNP: Invalid input(s): POCBNP  CBG: Recent Labs  Lab  09/16/18 0945 09/17/18 0730 09/18/18 0729 09/19/18 0752 09/19/18 0814  GLUCAP 133* 127* 273* 69* 36    Microbiology: Results for orders placed or performed during the hospital encounter of 09/15/18  Blood Culture (routine x 2)     Status: Abnormal   Collection Time: 09/15/18  9:29 PM  Result Value Ref Range Status   Specimen Description   Final    BLOOD RIGHT ARM Performed at [facility], [address] [place], [place] [address]    Special Requests   Final    BOTTLES DRAWN AEROBIC AND ANAEROBIC Blood Culture results may not be optimal due to an excessive volume of blood received in culture bottles Performed at [facility], [address] [place], [place] [address]    Culture  Setup Time   Final    IN BOTH AEROBIC AND ANAEROBIC BOTTLES GRAM NEGATIVE RODS CRITICAL VALUE NOTED.  VALUE IS CONSISTENT WITH PREVIOUSLY REPORTED AND CALLED VALUE. Performed at [facility], [address] [place], [place] [address]    Culture (A)  Final    ESCHERICHIA COLI SUSCEPTIBILITIES PERFORMED ON  PREVIOUS CULTURE WITHIN THE LAST 5 DAYS. Performed at [facility], [place] [address] [place], [place] [address]    Report Status 09/18/2018 FINAL  Final  Blood Culture (routine x 2)     Status: Abnormal   Collection Time: 09/15/18  9:29 PM  Result Value Ref Range Status   Specimen Description   Final    BLOOD LEFT ARM Performed at [facility], [address] [place], [place] [address]    Special Requests   Final    BOTTLES DRAWN AEROBIC AND ANAEROBIC Blood Culture adequate volume Performed at [facility], [address] [place], [place] [address]    Culture  Setup Time   Final    IN BOTH AEROBIC AND ANAEROBIC BOTTLES GRAM NEGATIVE RODS CRITICAL RESULT CALLED TO, READ BACK BY AND VERIFIED WITH: MELISSA Sandusky AT 1059 ON 09/16/18 BY SNJ Performed at [facility], [place] [address] [place], [place] [address]    Culture ESCHERICHIA COLI (A)  Final   Report Status 09/18/2018 FINAL  Final   Organism ID, Bacteria ESCHERICHIA COLI  Final      Susceptibility   Escherichia coli - MIC*    AMPICILLIN <=2 SENSITIVE Sensitive     CEFAZOLIN <=4 SENSITIVE Sensitive     CEFEPIME <=1 SENSITIVE Sensitive     CEFTAZIDIME <=1 SENSITIVE Sensitive     CEFTRIAXONE <=1 SENSITIVE Sensitive     CIPROFLOXACIN <=0.25 SENSITIVE Sensitive     GENTAMICIN <=1 SENSITIVE Sensitive     IMIPENEM <=0.25 SENSITIVE Sensitive     TRIMETH/SULFA <=20 SENSITIVE Sensitive     AMPICILLIN/SULBACTAM <=2 SENSITIVE Sensitive     PIP/TAZO <=4 SENSITIVE Sensitive     Extended ESBL NEGATIVE Sensitive     * ESCHERICHIA COLI  Blood Culture ID Panel (Reflexed)     Status: Abnormal   Collection Time: 09/15/18  9:29 PM  Result Value Ref Range Status   Enterococcus species NOT DETECTED NOT DETECTED Final   Listeria monocytogenes NOT DETECTED NOT DETECTED Final   Staphylococcus species NOT DETECTED NOT DETECTED Final   Staphylococcus aureus (BCID) NOT DETECTED NOT DETECTED Final   Streptococcus  species NOT DETECTED NOT DETECTED Final   Streptococcus agalactiae NOT DETECTED NOT DETECTED Final   Streptococcus pneumoniae NOT DETECTED NOT DETECTED Final   Streptococcus pyogenes NOT DETECTED NOT DETECTED Final  Acinetobacter baumannii NOT DETECTED NOT DETECTED Final   Enterobacteriaceae species DETECTED (A) NOT DETECTED Final    Comment: Enterobacteriaceae represent a large family of gram-negative bacteria, not a single organism. MELISSA MACCIA AT 4132 ON 09/16/18 BY SNJ    Enterobacter cloacae complex NOT DETECTED NOT DETECTED Final   Escherichia coli DETECTED (A) NOT DETECTED Final    Comment: CRITICAL RESULT CALLED TO, READ BACK BY AND VERIFIED WITH: MELISSA MACCIA AT 1059 ON 09/16/18 BY SNJ    Klebsiella oxytoca NOT DETECTED NOT DETECTED Final   Klebsiella pneumoniae NOT DETECTED NOT DETECTED Final   Proteus species NOT DETECTED NOT DETECTED Final   Serratia marcescens NOT DETECTED NOT DETECTED Final   Carbapenem resistance NOT DETECTED NOT DETECTED Final   Haemophilus influenzae NOT DETECTED NOT DETECTED Final   Neisseria meningitidis NOT DETECTED NOT DETECTED Final   Pseudomonas aeruginosa NOT DETECTED NOT DETECTED Final   Candida albicans NOT DETECTED NOT DETECTED Final   Candida glabrata NOT DETECTED NOT DETECTED Final   Candida krusei NOT DETECTED NOT DETECTED Final   Candida parapsilosis NOT DETECTED NOT DETECTED Final   Candida tropicalis NOT DETECTED NOT DETECTED Final    Comment: Performed at [facility], [address] [place], [place] [address]  Urine culture     Status: Abnormal   Collection Time: 09/15/18 10:02 PM  Result Value Ref Range Status   Specimen Description   Final    URINE, RANDOM Performed at [facility], [address], [place], [place] [address]    Special Requests   Final    NONE Performed at [facility], [address] [place], [place] [address]    Culture >=100,000 COLONIES/mL ESCHERICHIA COLI (A)  Final    Report Status 09/18/2018 FINAL  Final   Organism ID, Bacteria ESCHERICHIA COLI (A)  Final      Susceptibility   Escherichia coli - MIC*    AMPICILLIN <=2 SENSITIVE Sensitive     CEFAZOLIN <=4 SENSITIVE Sensitive     CEFTRIAXONE <=1 SENSITIVE Sensitive     CIPROFLOXACIN <=0.25 SENSITIVE Sensitive     GENTAMICIN <=1 SENSITIVE Sensitive     IMIPENEM <=0.25 SENSITIVE Sensitive     NITROFURANTOIN <=16 SENSITIVE Sensitive     TRIMETH/SULFA <=20 SENSITIVE Sensitive     AMPICILLIN/SULBACTAM <=2 SENSITIVE Sensitive     PIP/TAZO <=4 SENSITIVE Sensitive     Extended ESBL NEGATIVE Sensitive     * >=100,000 COLONIES/mL ESCHERICHIA COLI  MRSA PCR Screening     Status: None   Collection Time: 09/16/18 12:14 AM  Result Value Ref Range Status   MRSA by PCR NEGATIVE NEGATIVE Final    Comment:        The GeneXpert MRSA Assay (FDA approved for NASAL specimens only), is one component of a comprehensive MRSA colonization surveillance program. It is not intended to diagnose MRSA infection nor to guide or monitor treatment for MRSA infections. Performed at [facility], [address] [place], [place] [address]     Coagulation Studies: No results for input(s): LABPROT, INR in the last 72 hours.  Urinalysis: No results for input(s): COLORURINE, LABSPEC, PHURINE, GLUCOSEU, HGBUR, BILIRUBINUR, KETONESUR, PROTEINUR, UROBILINOGEN, NITRITE, LEUKOCYTESUR in the last 72 hours.  Invalid input(s): APPERANCEUR    Imaging: No results found.   Medications:   . pureflow 1 mL (09/17/18 1029)   . cephALEXin  500 mg Oral Q12H  . docusate sodium  100 mg Oral BID  . fentaNYL (SUBLIMAZE) injection  25 mcg Intravenous Once  . heparin  5,000 Units Subcutaneous Q8H  . lidocaine  10 mL Infiltration Once  . midazolam  1 mg Intravenous Once  . potassium chloride  20 mEq Oral Once  . tamsulosin  0.4 mg Oral QPC supper   acetaminophen **OR** acetaminophen, bisacodyl, guaiFENesin **AND**  dextromethorphan, heparin, HYDROcodone-acetaminophen, ondansetron **OR** ondansetron (ZOFRAN) IV, traZODone  Assessment/ Plan:  Mr. Samuel Pitts is a 72 y.o. white male with history of skin cancer, who was admitted to [facility] on12/03/2018   1. Acute renal failure secondary to hydronephrosis/ obstructive uropathy. Suspect underlying chronic kidney disease, NOS No baseline creatinine.  Required CRRT on admission. Stopped on 12/8. - No indication for renal replacement therapy at this time. HD catheter removed.  - Discontinue IV fluids - encourage PO intake.   2. Hypotension/sepsis: E. Coli - leukocytosis trending down.  - continue cephalexin  3. Anemia: with renal failure. Hemoglobin 9.1   LOS: 4 Jaylene Schrom 12/10/20192:09 PM

## 2018-09-19 NOTE — Care Management (Signed)
Physical therapy has recommended home health.  Patient declines need for service.  CM was unable to persuade patient to accept services but will accept a walker. Advance is preference for walker as it is the quickest.

## 2018-09-19 NOTE — Discharge Summary (Signed)
[facility] [place] NAME: Limuel Nieblas    MR#:  [id number]  DATE OF BIRTH:  [birth year]  DATE OF ADMISSION:  09/15/2018 ADMITTING PHYSICIAN: Amelia Jo, MD  DATE OF DISCHARGE: 09/19/2018  PRIMARY CARE PHYSICIAN: Patient, No Pcp Per    ADMISSION DIAGNOSIS:  Fever [R50.9] Sepsis, due to unspecified organism, unspecified whether acute organ dysfunction present ([place]) [A41.9]  DISCHARGE DIAGNOSIS:  Active Problems:   Sepsis ([place])   SECONDARY DIAGNOSIS:   Past Medical History:  Diagnosis Date  . Skin cancer    History of melanoma    HOSPITAL COURSE:   72 year old male with a history of skin cancer who presented to the emergency room due to generalized weakness.  1. Septic shock from E coli UTI/bacteremia with bilateral hydro-ureter nephrosis: Patient presentedwith fever, hypotensive tachycardia and leukocytosis. Patient has been off of pressors  He has been Changed to oral Keflex per sensitivities and will need 10 more days of treatment.  . Shock has resolved.     2. Bilateral hydroureter nephrosis: Patient has been evaluated by urology. Mentations are to maintain Foley catheter for postobstructive diuresis. He will need a Foley 10 to 14 days and outpatient voiding trial Will continue Flomax   3. Acute renal failure due to hydronephrosis: Patient did receive CRRT but has been off of this for several days.  Creatinine is responding well as well as urine output.   4. Hypokalemia: Resolved  5. Elevated troponin in the setting of poor renal clearance and demand ischemia with septic shock  6. Acute metabolic encephalopathy due to septic shock and acute renal failure: Patient is at baseline  7. Elevated liver function tests in the setting of septic shock: Function tests have improved.  DISCHARGE CONDITIONS AND DIET:   Patient is stable for discharge on regular diet  CONSULTS OBTAINED:  Treatment  Team:  Anthonette Legato, MD Billey Co, MD  DRUG ALLERGIES:   Allergies  Allergen Reactions  . Omeprazole     SEVERE STOMACH PAIN    DISCHARGE MEDICATIONS:   Allergies as of 09/19/2018      Reactions   Omeprazole    SEVERE STOMACH PAIN      Medication List    STOP taking these medications   amoxicillin 500 MG capsule Commonly known as:  AMOXIL   clarithromycin 500 MG tablet Commonly known as:  BIAXIN     TAKE these medications   cephALEXin 500 MG capsule Commonly known as:  KEFLEX Take 1 capsule (500 mg total) by mouth every 12 (twelve) hours for 14 days.   metroNIDAZOLE 500 MG tablet Commonly known as:  FLAGYL Take 500 mg by mouth 2 (two) times daily.   omeprazole 40 MG capsule Commonly known as:  PRILOSEC Take 40 mg by mouth daily.   tamsulosin 0.4 MG Caps capsule Commonly known as:  FLOMAX Take 1 capsule (0.4 mg total) by mouth daily after supper.         Today   CHIEF COMPLAINT:  No acute issues overnight.  Patient reports he is feeling the best he has since he has been here.   VITAL SIGNS:  Blood pressure 139/71, pulse 71, temperature 97.8 F (36.6 C), temperature source Oral, resp. rate 18, height 6' (1.829 m), weight 71.1 kg, SpO2 95 %.   REVIEW OF SYSTEMS:  Review of Systems  Constitutional: Negative.  Negative for chills, fever and malaise/fatigue.  HENT: Negative.  Negative for ear discharge, ear pain, hearing  loss, nosebleeds and sore throat.   Eyes: Negative.  Negative for blurred vision and pain.  Respiratory: Negative.  Negative for cough, hemoptysis, shortness of breath and wheezing.   Cardiovascular: Negative.  Negative for chest pain, palpitations and leg swelling.  Gastrointestinal: Negative.  Negative for abdominal pain, blood in stool, diarrhea, nausea and vomiting.  Genitourinary: Negative.  Negative for dysuria.  Musculoskeletal: Negative.  Negative for back pain.  Skin: Negative.   Neurological: Negative for  dizziness, tremors, speech change, focal weakness, seizures and headaches.  Endo/Heme/Allergies: Negative.  Does not bruise/bleed easily.  Psychiatric/Behavioral: Negative.  Negative for depression, hallucinations and suicidal ideas.     PHYSICAL EXAMINATION:  GENERAL:  72 y.o.-year-old patient lying in the bed with no acute distress.  NECK:  Supple, no jugular venous distention. No thyroid enlargement, no tenderness.  LUNGS: Normal breath sounds bilaterally, no wheezing, rales,rhonchi  No use of accessory muscles of respiration.  CARDIOVASCULAR: S1, S2 normal. No murmurs, rubs, or gallops.  ABDOMEN: Soft, non-tender, non-distended. Bowel sounds present. No organomegaly or mass.  EXTREMITIES: No pedal edema, cyanosis, or clubbing.  PSYCHIATRIC: The patient is alert and oriented x 3.  SKIN: No obvious rash, lesion, or ulcer.   DATA REVIEW:   CBC Recent Labs  Lab 09/19/18 0347  WBC 24.3*  HGB 9.1*  HCT 27.2*  PLT 182    Chemistries  Recent Labs  Lab 09/18/18 0616 09/19/18 0347  NA 140 143  K 3.6 3.2*  CL 110 115*  CO2 24 23  GLUCOSE 151* 89  BUN 38* 41*  CREATININE 2.02* 2.07*  CALCIUM 6.9* 7.1*  MG 2.2  --   AST  --  48*  ALT  --  50*  ALKPHOS  --  221*  BILITOT  --  0.6    Cardiac Enzymes Recent Labs  Lab 09/16/18 1120 09/16/18 1521 09/17/18 0310  TROPONINI 1.84* 1.23* 1.08*    Microbiology Results  @MICRORSLT48 @  RADIOLOGY:  No results found.    Allergies as of 09/19/2018      Reactions   Omeprazole    SEVERE STOMACH PAIN      Medication List    STOP taking these medications   amoxicillin 500 MG capsule Commonly known as:  AMOXIL   clarithromycin 500 MG tablet Commonly known as:  BIAXIN     TAKE these medications   cephALEXin 500 MG capsule Commonly known as:  KEFLEX Take 1 capsule (500 mg total) by mouth every 12 (twelve) hours for 14 days.   metroNIDAZOLE 500 MG tablet Commonly known as:  FLAGYL Take 500 mg by mouth 2 (two)  times daily.   omeprazole 40 MG capsule Commonly known as:  PRILOSEC Take 40 mg by mouth daily.   tamsulosin 0.4 MG Caps capsule Commonly known as:  FLOMAX Take 1 capsule (0.4 mg total) by mouth daily after supper.          Management plans discussed with the patient and he is in agreement. Stable for discharge   Patient should follow up with urology  CODE STATUS:     Code Status Orders  (From admission, onward)         Start     Ordered   09/15/18 2353  Full code  Continuous     09/15/18 2352        Code Status History    This patient has a current code status but no historical code status.    Advance Directive Documentation  Most Recent Value  Type of Advance Directive  Healthcare Power of Attorney  Pre-existing out of facility DNR order (yellow form or pink MOST form)  -  "MOST" Form in Place?  -      TOTAL TIME TAKING CARE OF THIS PATIENT: 38 minutes.    Note: This dictation was prepared with Dragon dictation along with smaller phrase technology. Any transcriptional errors that result from this process are unintentional.  Xaden Kaufman M.D on 09/19/2018 at 10:54 AM  Between 7am to 6pm - Pager - [phone] After 6pm go to [email or web address] - password [email or web address] [facility]  Office  [phone]  CC: Primary care physician; Patient, No Pcp Per

## 2018-09-19 NOTE — Care Management Important Message (Signed)
Important Message  Patient Details  Name: Samuel Pitts MRN: [id number] Date of Birth: January 07, 1946   Medicare Important Message Given:  Yes    Juliann Pulse A Curtis Cain 09/19/2018, 10:44 AM

## 2018-09-19 NOTE — Progress Notes (Signed)
Pharmacy Electrolyte Monitoring Consult:  Pharmacy consulted to assist in monitoring and replacing electrolytes in this 72 y.o. male admitted on 09/15/2018 with Weakness   Labs:  Sodium (mmol/L)  Date Value  09/19/2018 143   Potassium (mmol/L)  Date Value  09/19/2018 3.2 (L)   Magnesium (mg/dL)  Date Value  09/18/2018 2.2   Phosphorus (mg/dL)  Date Value  09/18/2018 3.4   Calcium (mg/dL)  Date Value  09/19/2018 7.1 (L)   Albumin (g/dL)  Date Value  09/19/2018 1.8 (L)   Corrected Calcium: 8.66   Assessment/Plan: On CRRT  K 3.2 - will supplement cautiously with KCl 30mEq po times 1.   Will recheck labs in AM  Paulina Fusi, PharmD, BCPS 09/19/2018 10:01 AM

## 2018-09-19 NOTE — Progress Notes (Signed)
Physical Therapy Treatment Patient Details Name: Samuel Pitts MRN: [id number] DOB: [birth year] Today's Date: 09/19/2018    History of Present Illness presented to ER secondary to AMS, lethargy; admitted with sepsis, metabolic encephalopathy related to UTI with bilat hydroureteronephrosis    PT Comments    Patient status post removal of R temp fem cath approx 1100 this date; cleared for OOB activity at this time.  Able to complete bed mobility with mod indep; sit/stand, basic transfers and gait (50' without assist device, min assist; 180' with RW, cga/close sup).  Higher level balance deficits noted, but improved with use of RW for external stabilization.  Min cuing for safety awareness and impulse control throughout session.  Static standing balance without assist device, close sup; limited functional reach outside immediate BOS. Do recommend continued use of RW and HHPT at this time. Of note, patient R temp fem cath site clean, dry before and after treatment session.   Follow Up Recommendations  Home health PT     Equipment Recommendations  Rolling walker with 5" wheels    Recommendations for Other Services       Precautions / Restrictions Precautions Precautions: Fall Restrictions Weight Bearing Restrictions: No    Mobility  Bed Mobility Overal bed mobility: Needs Assistance Bed Mobility: Supine to Sit     Supine to sit: Supervision        Transfers Overall transfer level: Needs assistance Equipment used: None Transfers: Sit to/from Stand Sit to Stand: Min guard;Min assist         General transfer comment: multiple attempts, use of momentum and bilat UEs to complete transfer  Ambulation/Gait Ambulation/Gait assistance: Min assist Gait Distance (Feet): 60 Feet Assistive device: None       General Gait Details: increased sway R/L, mild/mod gait deviations and LOB with dynamic gait components (using LE step strategy, min assist from therapist to  recover)   Stairs             Wheelchair Mobility    Modified Rankin (Stroke Patients Only)       Balance Overall balance assessment: Needs assistance Sitting-balance support: No upper extremity supported;Feet supported Sitting balance-Leahy Scale: Good     Standing balance support: No upper extremity supported                                Cognition Arousal/Alertness: Awake/alert Behavior During Therapy: WFL for tasks assessed/performed Overall Cognitive Status: Within Functional Limits for tasks assessed                                        Exercises Other Exercises Other Exercises: 200' with RW, cga/close sup-improved mechanics, gait symmetry and overall stability with use of RW; min cuing for walker position/management and overall safety.  Vitals stable and WFL throughout.    General Comments        Pertinent Vitals/Pain Pain Assessment: No/denies pain    Home Living                      Prior Function            PT Goals (current goals can now be found in the care plan section) Acute Rehab PT Goals Patient Stated Goal: to get up and going PT Goal Formulation: With patient Time For Goal Achievement: 10/02/18 Potential to  Achieve Goals: Good Progress towards PT goals: Progressing toward goals    Frequency    Min 2X/week      PT Plan Current plan remains appropriate    Co-evaluation              AM-PAC PT "6 Clicks" Mobility   Outcome Measure  Help needed turning from your back to your side while in a flat bed without using bedrails?: None Help needed moving from lying on your back to sitting on the side of a flat bed without using bedrails?: None Help needed moving to and from a bed to a chair (including a wheelchair)?: A Little Help needed standing up from a chair using your arms (e.g., wheelchair or bedside chair)?: A Little Help needed to walk in hospital room?: A Little Help needed  climbing 3-5 steps with a railing? : A Little 6 Click Score: 20    End of Session Equipment Utilized During Treatment: Gait belt Activity Tolerance: Patient tolerated treatment well Patient left: in chair;with call bell/phone within reach;with chair alarm set Nurse Communication: Mobility status PT Visit Diagnosis: Muscle weakness (generalized) (M62.81);Difficulty in walking, not elsewhere classified (R26.2)     Time: 3254-9826 PT Time Calculation (min) (ACUTE ONLY): 29 min  Charges:  $Gait Training: 8-22 mins $Therapeutic Activity: 8-22 mins                     Aidee Latimore H. Owens Shark, PT, DPT, NCS 09/19/18, 4:02 PM [phone]  Evaluation/treatment session was lead and completed by Belva Crome, SPT; directly observed, guided and documented by Tera Helper, PT.

## 2018-09-19 NOTE — Care Management (Signed)
Physical therapy consult pending.  Therapy has been contraindicated due to temporary hemodialysis catheter in his groin.Per primary nurse, cath was removed this morning around 10:30.   It is contraindicated for patient to be up for several hours

## 2018-09-25 ENCOUNTER — Telehealth: Payer: Self-pay

## 2018-09-25 NOTE — Telephone Encounter (Signed)
EMMI Follow-up: Noted on the report that the patient didn't know who to call if there was a change in his condition.  His wife had also left a message on my voicemail for me to call.  I talked with [name] and he asked that I speak with his wife, Samuel Pitts.  I gave her the number to the [facility] Physician Referral Service to get an appointment with a PCP.  [name] he was still very weak but his appetite had improved and he was eating child-size portions now.  Has follow-up appointment with Dr. Diamantina Providence on 10/16/18 and asked if she could change MD's.  She would prefer see another MD in that office and I told her she would need to call the MD office and discuss with them.  She had the After Visit Summary and knew the phone was listed on there. I let them know there would be a second automated call with a different series of questions and to let us know if they had any other concerns at that time.

## 2018-10-16 ENCOUNTER — Encounter: Payer: Self-pay | Admitting: Urology

## 2018-10-16 ENCOUNTER — Ambulatory Visit (INDEPENDENT_AMBULATORY_CARE_PROVIDER_SITE_OTHER): Payer: Medicare Other | Admitting: Urology

## 2018-10-16 VITALS — BP 145/89 | HR 118 | Ht 72.0 in | Wt 160.7 lb

## 2018-10-16 DIAGNOSIS — R339 Retention of urine, unspecified: Secondary | ICD-10-CM | POA: Diagnosis not present

## 2018-10-16 LAB — URINALYSIS, COMPLETE
Bilirubin, UA: NEGATIVE
Glucose, UA: NEGATIVE
Ketones, UA: NEGATIVE
Nitrite, UA: POSITIVE — AB
Specific Gravity, UA: 1.015 (ref 1.005–1.030)
Urobilinogen, Ur: 0.2 mg/dL (ref 0.2–1.0)
pH, UA: 6 (ref 5.0–7.5)

## 2018-10-16 LAB — MICROSCOPIC EXAMINATION: Epithelial Cells (non renal): NONE SEEN /hpf (ref 0–10)

## 2018-10-16 MED ORDER — TAMSULOSIN HCL 0.4 MG PO CAPS: 0.4000 mg | ORAL_CAPSULE | Freq: Every day | ORAL | 11 refills | Status: AC

## 2018-10-16 MED ORDER — SULFAMETHOXAZOLE-TRIMETHOPRIM 800-160 MG PO TABS: 1.0000 | ORAL_TABLET | Freq: Two times a day (BID) | ORAL | 0 refills | Status: DC

## 2018-10-16 MED ORDER — FLUCONAZOLE 100 MG PO TABS: 100.0000 mg | ORAL_TABLET | Freq: Every day | ORAL | 0 refills | Status: AC

## 2018-10-16 NOTE — Progress Notes (Signed)
Catheter Removal  Patient is present today for a catheter removal.  53ml of water was drained from the balloon. A 16FR foley cath was removed from the bladder no complications were noted . Patient tolerated well.  Preformed by: Elberta Leatherwood, CMA  Continuous Intermittent Catheterization  Due to urinary retention patient is present today for a teaching of self I & O Catheterization. Patient was given detailed verbal and printed instructions of self catheterization. Patient was cleaned and prepped in a sterile fashion.  With instruction and assistance patient inserted a 14FR and urine return was noted 750 ml, urine was yellow in color. Patient tolerated well, no complications were noted Patient was given a sample bag with supplies to take home.  Instructions were given per [facility] for patient to cath 3 times daily. Patient is to follow up after Urodynamic study  Preformed by: Elberta Leatherwood, CMA

## 2018-10-16 NOTE — Progress Notes (Signed)
10/16/2018 9:32 AM   Samuel Pitts [birth year] [id number]  CC: Urinary retention  HPI: I saw Samuel Pitts in urology clinic today for hospital follow-up of urinary retention.  He is a 73 year old male who does not get regular medical care who was found down at home on 09/15/2018, and ultimately admitted to the ICU with sepsis from urinary origin, bilateral hydroureteronephrosis down to a distended bladder, and AKI.  Urine and blood cultures grew pansensitive E. Coli. He improved with antibiotics and Foley placement.  He does report that he was having incontinence prior to this hospitalization worrisome for ongoing retention with overflow incontinence.  He has been on Flomax since discharge from the hospital.  He denies any gross hematuria.  There is no family history of prostate cancer.  There are no recent PSAs to review.  He reports he is overall doing well and has no complaints.  There are no aggravating or alleviating factors.  Severity is moderate to severe.   PMH: Past Medical History:  Diagnosis Date  . Skin cancer    History of melanoma    Surgical History: Past Surgical History:  Procedure Laterality Date  . MELANOMA EXCISION      Allergies:  Allergies  Allergen Reactions  . Omeprazole     SEVERE STOMACH PAIN    Family History: History reviewed. No pertinent family history.  Social History:  reports that he has never smoked. He has never used smokeless tobacco. He reports that he does not drink alcohol or use drugs.  ROS: Please see flowsheet from today's date for complete review of systems.  Physical Exam: BP (!) 145/89 (BP Location: Left Arm, Patient Position: Sitting, Cuff Size: Normal)   Pulse (!) 118   Ht 6' (1.829 m)   Wt 160 lb 11.2 oz (72.9 kg)   BMI 21.79 kg/m    Constitutional: Disheveled, alert and oriented Cardiovascular: No clubbing, cyanosis, or edema. Respiratory: Normal respiratory effort, no increased work of breathing. GI:  Abdomen is soft, nontender, nondistended, no abdominal masses GU: No CVA tenderness, phallus without lesions, widely patent meatus, testicles descended without masses bilaterally DRE: 60 g, smooth, no nodules Lymph: No cervical or inguinal lymphadenopathy. Skin: No rashes, bruises or suspicious lesions. Neurologic: Grossly intact, no focal deficits, moving all 4 extremities. Psychiatric: Normal mood and affect.  Laboratory Data: Reviewed No BMP since hospitalization, last creatinine 2.07 on 09/19/2018, equating to eGFR of 31  Pertinent Imaging: I have personally reviewed the CT stone protocol dated 09/15/2018.  Bilateral hydroureteronephrosis down to a thick-walled bladder, prostate measures 66 g.  Assessment & Plan:   In summary, Samuel Pitts is a 73 year old male who does not get regular medical care who was recently hospitalized in early December with acute urinary retention and sepsis from urinary origin with AKI.  He was treated and improved with culture appropriate antibiotics and Foley placement.  He presents today to discuss bladder management options.  He has been on Flomax since discharge.  Foley was removed today and he was unable to void during the day with PVR greater than 600 cc when he returned to clinic.  He was taught clean intermittent catheterization, given supplies, and instructed to perform catheterization 3 times daily.  We also prescribed Bactrim and fluconazole in the setting of his prior long-term indwelling Foley, with bacteria and yeast on urinalysis today.  Urine culture is pending.  We had a long discussion about his symptoms today.  We specifically discussed the importance of determining bladder outlet obstruction  versus atonic bladder in terms of further treatment strategies.  -Urodynamics in [place], follow-up to discuss results -Consider TURP if demonstrated bladder outlet obstruction, continue CIC if atonic bladder  Billey Co, MD  [place] [address] [place], [place] [address] [phone]

## 2018-10-16 NOTE — Patient Instructions (Signed)

## 2018-10-17 ENCOUNTER — Telehealth: Payer: Self-pay | Admitting: Urology

## 2018-10-17 NOTE — Telephone Encounter (Signed)
Pt called and has a question about catheters.  He doesn't have to use catheters.  He was supposed to use 3 x per day, now he doesn't have to use at all now.

## 2018-10-18 LAB — CULTURE, URINE COMPREHENSIVE

## 2018-10-20 NOTE — Telephone Encounter (Signed)
Spoke to patient and he seems to be emptying his bladder normally now. I informed him to keep the catheters and if he has any problems urinating he will be able to use them.

## 2018-10-23 ENCOUNTER — Telehealth: Payer: Self-pay | Admitting: Urology

## 2018-10-23 NOTE — Telephone Encounter (Signed)
Please call pt about current UTI he is experiencing.  He has been having a fever.  Last night he had a little stream of blood come out.

## 2018-10-24 NOTE — Telephone Encounter (Signed)
Spoke to patient and he is feeling ok. He wants to wait to be seen at his next appointment.

## 2018-10-30 ENCOUNTER — Telehealth: Payer: Self-pay | Admitting: Urology

## 2018-10-30 NOTE — Telephone Encounter (Signed)
FYI Patient declined to schedule UDS   Sharyn Lull

## 2018-11-14 ENCOUNTER — Encounter: Payer: Self-pay | Admitting: Emergency Medicine

## 2018-11-14 ENCOUNTER — Emergency Department
Admission: EM | Admit: 2018-11-14 | Discharge: 2018-11-14 | Disposition: A | Discharge status: Home / Self Care | Payer: Medicare Other | Attending: Student in an Organized Health Care Education/Training Program | Admitting: Student in an Organized Health Care Education/Training Program

## 2018-11-14 ENCOUNTER — Other Ambulatory Visit: Payer: Self-pay

## 2018-11-14 DIAGNOSIS — Z85828 Personal history of other malignant neoplasm of skin: Secondary | ICD-10-CM | POA: Diagnosis not present

## 2018-11-14 DIAGNOSIS — Z79899 Other long term (current) drug therapy: Secondary | ICD-10-CM | POA: Insufficient documentation

## 2018-11-14 DIAGNOSIS — I10 Essential (primary) hypertension: Secondary | ICD-10-CM | POA: Insufficient documentation

## 2018-11-14 LAB — BASIC METABOLIC PANEL
Anion gap: 7 (ref 5–15)
BUN: 9 mg/dL (ref 8–23)
CO2: 34 mmol/L — ABNORMAL HIGH (ref 22–32)
Calcium: 8.7 mg/dL — ABNORMAL LOW (ref 8.9–10.3)
Chloride: 102 mmol/L (ref 98–111)
Creatinine, Ser: 1.52 mg/dL — ABNORMAL HIGH (ref 0.61–1.24)
GFR calc Af Amer: 52 mL/min — ABNORMAL LOW (ref >60)
GFR calc non Af Amer: 45 mL/min — ABNORMAL LOW (ref >60)
Glucose, Bld: 107 mg/dL — ABNORMAL HIGH (ref 70–99)
POTASSIUM: 3.3 mmol/L — AB (ref 3.5–5.1)
SODIUM: 143 mmol/L (ref 135–145)

## 2018-11-14 LAB — URINALYSIS, COMPLETE (UACMP) WITH MICROSCOPIC
Bacteria, UA: NONE SEEN
Bilirubin Urine: NEGATIVE
Glucose, UA: NEGATIVE mg/dL
Hgb urine dipstick: NEGATIVE
Ketones, ur: NEGATIVE mg/dL
Leukocytes, UA: NEGATIVE
Nitrite: NEGATIVE
Protein, ur: NEGATIVE mg/dL
SQUAMOUS EPITHELIAL / LPF: NONE SEEN (ref 0–5)
Specific Gravity, Urine: 1.004 — ABNORMAL LOW (ref 1.005–1.030)
pH: 8 (ref 5.0–8.0)

## 2018-11-14 MED ORDER — AMLODIPINE BESYLATE 5 MG PO TABS
5.0000 mg | ORAL_TABLET | Freq: Once | ORAL | Status: AC
  Administered 2018-11-14: 5 mg via ORAL
  Filled 2018-11-14: qty 1

## 2018-11-14 MED ORDER — AMLODIPINE BESYLATE 5 MG PO TABS: 5.0000 mg | ORAL_TABLET | Freq: Every day | ORAL | 0 refills | Status: AC

## 2018-11-14 NOTE — ED Provider Notes (Signed)
[facility] Emergency Department Provider Note    First MD Initiated Contact with Patient 11/14/18 1354     (approximate)  I have reviewed the triage vital signs and the nursing notes.   HISTORY  Chief Complaint Hypertension    HPI Samuel Pitts is a 73 y.o. male below listed past medical history with no history of hypertension presents the ER due to concern for elevated blood pressure.  Was recently started on Cipro for UTI by his urologist.  States he started feeling bloated so he took his blood pressure and was elevated to 200.  Denies any chest pain or shortness of breath.  Denies any fainting spells.  Denies any blurry vision, headaches other discomfort.  Past Medical History:  Diagnosis Date  . Skin cancer    History of melanoma   No family history on file. Past Surgical History:  Procedure Laterality Date  . MELANOMA EXCISION     Patient Active Problem List   Diagnosis Date Noted  . Sepsis ([place]) 09/15/2018      Prior to Admission medications   Medication Sig Start Date End Date Taking? Authorizing Provider  amLODipine (NORVASC) 5 MG tablet Take 1 tablet (5 mg total) by mouth daily. 11/14/18 11/14/19  Merlyn Lot, MD  fluconazole (DIFLUCAN) 100 MG tablet Take 1 tablet (100 mg total) by mouth daily. X 7 days 10/16/18   Billey Co, MD  metroNIDAZOLE (FLAGYL) 500 MG tablet Take 500 mg by mouth 2 (two) times daily.    [provider]  omeprazole (PRILOSEC) 40 MG capsule Take 40 mg by mouth daily.    [provider]  sulfamethoxazole-trimethoprim (BACTRIM DS,SEPTRA DS) 800-160 MG tablet Take 1 tablet by mouth 2 (two) times daily. 10/16/18   Billey Co, MD  tamsulosin (FLOMAX) 0.4 MG CAPS capsule Take 1 capsule (0.4 mg total) by mouth daily after supper. 10/16/18   Billey Co, MD    Allergies Omeprazole    Social History Social History   Tobacco Use  . Smoking status: Never Smoker  . Smokeless  tobacco: Never Used  Substance Use Topics  . Alcohol use: Never    Frequency: Never  . Drug use: Never    Review of Systems Patient denies headaches, rhinorrhea, blurry vision, numbness, shortness of breath, chest pain, edema, cough, abdominal pain, nausea, vomiting, diarrhea, dysuria, fevers, rashes or hallucinations unless otherwise stated above in HPI. ____________________________________________   PHYSICAL EXAM:  VITAL SIGNS: Vitals:   11/14/18 1232  BP: (!) 165/97  Pulse: 83  Resp: 18  Temp: 98 F (36.7 C)  SpO2: 98%    Constitutional: Alert and oriented. Well appearing and in no acute distress. Eyes: Conjunctivae are normal.  Head: Atraumatic. Nose: No congestion/rhinnorhea. Mouth/Throat: Mucous membranes are moist.   Neck: Painless ROM.  Cardiovascular:   Good peripheral circulation. Respiratory: Normal respiratory effort.  No retractions.  Gastrointestinal: Soft and nontender.  Musculoskeletal: No lower extremity tenderness .  No joint effusions. Neurologic:  Normal speech and language. No gross focal neurologic deficits are appreciated.  Skin:  Skin is warm, dry and intact. No rash noted. Psychiatric: Mood and affect are normal. Speech and behavior are normal.  ____________________________________________   LABS (all labs ordered are listed, but only abnormal results are displayed)  Results for orders placed or performed during the hospital encounter of 11/14/18 (from the past 24 hour(s))  Basic metabolic panel     Status: Abnormal   Collection Time: 11/14/18  2:52 PM  Result Value Ref Range   Sodium 143 135 - 145 mmol/L   Potassium 3.3 (L) 3.5 - 5.1 mmol/L   Chloride 102 98 - 111 mmol/L   CO2 34 (H) 22 - 32 mmol/L   Glucose, Bld 107 (H) 70 - 99 mg/dL   BUN 9 8 - 23 mg/dL   Creatinine, Ser 1.52 (H) 0.61 - 1.24 mg/dL   Calcium 8.7 (L) 8.9 - 10.3 mg/dL   GFR calc non Af Amer 45 (L) >60 mL/min   GFR calc Af Amer 52 (L) >60 mL/min   Anion gap 7 5 - 15    ____________________________________________ _________________________________  RADIOLOGY   ____________________________________________   PROCEDURES  Procedure(s) performed:  Procedures    Critical Care performed: no ____________________________________________   INITIAL IMPRESSION / ASSESSMENT AND PLAN / ED COURSE  Pertinent labs & imaging results that were available during my care of the patient were reviewed by me and considered in my medical decision making (see chart for details).  DDX: Hypertension, AKI, renal dysfunction, anxiety  Samuel Pitts is a 73 y.o. who presents to the ED with essentially asymptomatic hypertension.  Patient nontoxic and in no acute distress.  He is asymptomatic with reassuring neuro findings.  Blood work is reassuring.  Will start patient on Norvasc given his hypertension with systolic reported at home of 200.  Will give follow-up for PCP for further hypertension management.      ____________________________________________   FINAL CLINICAL IMPRESSION(S) / ED DIAGNOSES  Final diagnoses:  Hypertension, unspecified type      NEW MEDICATIONS STARTED DURING THIS VISIT:  New Prescriptions   AMLODIPINE (NORVASC) 5 MG TABLET    Take 1 tablet (5 mg total) by mouth daily.     Note:  This document was prepared using Dragon voice recognition software and may include unintentional dictation errors.     Merlyn Lot, MD 11/14/18 1538

## 2018-11-14 NOTE — ED Triage Notes (Signed)
Pt was started on ciprofloxacin for treatment of a UTI on 1/28. Pt states he has been monitoring his blood pressure at home and for the last two days "it has been high." Blood pressure in triage 165/97. Pt denies any complaints of symptoms. Pt denies HTN hx.

## 2018-11-16 ENCOUNTER — Ambulatory Visit: Payer: Medicare Other | Admitting: Urology

## 2018-11-27 ENCOUNTER — Ambulatory Visit (INDEPENDENT_AMBULATORY_CARE_PROVIDER_SITE_OTHER): Payer: Medicare Other | Admitting: Urology

## 2018-11-27 ENCOUNTER — Encounter: Payer: Self-pay | Admitting: Urology

## 2018-11-27 VITALS — BP 164/84 | HR 98 | Ht 69.0 in | Wt 166.0 lb

## 2018-11-27 DIAGNOSIS — R339 Retention of urine, unspecified: Secondary | ICD-10-CM

## 2018-11-27 LAB — BLADDER SCAN AMB NON-IMAGING

## 2018-11-27 NOTE — Progress Notes (Signed)
   11/27/2018 2:19 PM   Samuel Pitts 12/25/1945 [id number]  Reason for visit: Follow up BPH/urinary retention  HPI: I saw [name] back in urology clinic to discuss his BPH and urinary retention.  He was originally hospitalized in December 2019 when he was found down at home and admitted to the ICU with sepsis from a urinary source and bilateral hydroureteronephrosis down to a distended bladder and AKI.  His urine and blood cultures grew pansensitive E. coli.  He improved with antibiotics and Foley placement.  He was seen in follow-up and failed a void trial with an elevated PVR of greater than 600 and started on CIC.  I recommended follow-up for urodynamics to evaluate for atonic bladder versus bladder outlet obstruction, however the patient did not follow-up for urodynamics in [place].  In addition, he was not performing CIC at home, and continues to have overflow incontinence with elevated bladder scan.  He voided 300 cc in clinic with a PVR of greater than 700 cc.  He reports he does not have the urge to void currently.  He reports he has significant voiding symptoms at baseline with urinary frequency, urgency, incontinence, and severe incontinence overnight.  His IPSS score today in clinic is 13, with quality of life terrible.  On CT from December 2019, prostate volume is 80 g.  His creatinine remains elevated at 1.52, with EGFR of 45, likely secondary to chronic obstruction.  We had a very long conversation today about the importance of addressing his overflow incontinence with resulting chronic kidney disease, and UTI, and even urosepsis with prolonged hospitalization in December 2019.  I discussed with him that if we do not intervene he is very likely to end up with either renal failure, or back in the ICU with recurrent episodes of retention, and even severe urosepsis.  I stressed the importance of this, and that failure to follow-up and follow treatment recommendations  would result in renal failure, dialysis, or even death.  I discussed the concept of overflow incontinence to him at length today.  -He is willing to follow-up for urodynamics to evaluate for bladder function.  If he has a functional bladder, would recommend HOLEP for outlet obstruction.  If his bladder is atonic, would recommend ongoing CIC.  -We again taught the patient CIC and provided him with catheters, and he will catheterize at least twice per day until definitive treatment determined.  -We will need PCP clearance prior to any surgical intervention  A total of 15 minutes were spent face-to-face with the patient, greater than 50% was spent in patient education, counseling, and coordination of care regarding BPH, urinary retention, renal failure, urosepsis, need for urodynamics.   Billey Co, [facility] [address], [place], [place] [address] [phone]

## 2018-11-27 NOTE — Patient Instructions (Signed)
Self Cath TWICE DAILY even if you do not feel the urge to void  Please go to your appointment in [facility] for Urodynamics         Urodynamic Testing What is urodynamic testing?  Urodynamic tests are done to determine how well your lower urinary tract is working. The lower urinary tract includes your bladder and the part of your body that drains urine from the bladder (urethra). When your kidneys filter your blood, urine is stored in your bladder until you feel the urge to urinate. Urination requires coordination between the nerves and muscles of your bladder and urethra. When your lower urinary tract is working well, you should be able to:  Start urinating when your bladder is full.  Empty your bladder completely.  Control the flow of your urine. Why do I need urodynamic testing? You may need urodynamic testing to help find the cause of any of these problems:  Leaking urine (incontinence).  Problems starting or stopping your urine flow.  Frequent or painful urination.  Frequent urinary tract infections.  Being unable to empty your bladder completely.  Having strong urges to pass urine (urgency).  Having a weak flow of urine. How do I prepare for the tests?  Ask your health care provider about changing or stopping your regular medicines. This is especially important if you are taking diabetes medicines or blood thinners.  You may be asked to avoid urinating before coming to the test so that you arrive with a full bladder.  Tell a health care provider about: ? Any allergies you have. ? All medicines you are taking, including vitamins, herbs, eye drops, creams, and over-the-counter medicines. ? Whether you are pregnant or may be pregnant. What are the risks of this testing? Generally, these tests are safe. However, some of the tests have risks, including:  Discomfort.  Frequent urge to urinate.  Bleeding.  Infection.  Allergic reactions to medicines or dyes  (contrast material). How is urodynamic testing done? You may have various urodynamic tests. The tests may be done separately or may all be done during one testing visit. You may be given an antibiotic medicine before or after testing to help prevent infection. The types of tests that may be done include: Uroflowmetry This test measures how much urine you pass and how long it takes to pass.  You will urinate into a certain type of toilet or device (flowmeter).  The device will measure the volume and the time of your urine flow.  These measurements will be sent to a computer that creates a graph of your urine flow. Postvoid residual measurement This test measures how much urine is left in your bladder after you urinate.  The test may be done with ultrasound. In this method, sound waves and a computer will be used to create an image of your bladder.  The test can also be done by inserting a thin, flexible tube (catheter) into your bladder after you urinate. The remaining urine will be removed through the catheter so it can be measured.  Remaining urine will be measured in milliliters (mL). If you have more than 100 mL left in your bladder after you urinate, your bladder is not emptying as it should. Cystometric testing This test uses a type of bladder catheter that can measure pressure.  You may be given a medicine to numb the area (local anesthetic).  The area around the opening of your urethra will be cleaned.  A urinary catheter will be passed through  your urethra into your bladder and used to empty your bladder completely.  Then a measuring catheter will be placed, and your bladder will be filled with warm, germ-free (sterile) water.  Pressure measurements will be taken: ? As your bladder fills. ? When you feel the need to urinate. ? As your bladder is emptied.  You may be asked to cough or bear down to check for leakage.  In some cases, your bladder may be filled with a  material that shows up on X-rays (contrast material) so that X-ray pictures can be taken during the test. Electromyogram This test measures the electrical activity of the nerves and muscles of your bladder and the opening of your urethra.  Sticky patches (electrodes) will be placed near your rectum and urethra to measure electrical activity.  The measurements will show how well your nerves are communicating with your muscles. What happens after the testing?  You should be able to go home right away and do your usual activities.  You may be told to drink a glass of water every 30 minutes for the first 2 hours after testing.  Taking a warm bath or using warm, wet cloths (warm compresses) may relieve any discomfort near your urethra.  Contact your health care provider if you have: ? Pain. ? Blood in your urine. ? Chills. ? Fever. What do the results mean? Talk with your health care provider about what your results mean. Some common causes for abnormal results from urodynamic tests include:  Enlarged prostate in men.  Overactive bladder.  Urinary tract infection.  Nervous system diseases.  Spinal cord damage. Questions to ask your health care provider Ask your health care provider, or the department that is doing the test:  When will my results be ready?  How will I get my results?  What are my treatment options?  What other tests do I need?  What are my next steps? Summary  Urodynamic tests are done to determine how well your lower urinary tract is working. The lower urinary tract includes your bladder and urethra.  You may need urodynamic testing to help find the cause of various problems with urination, such as leaking urine (incontinence) or problems starting or stopping your urine flow.  You may have various urodynamic tests. The tests may be done separately or may all be done during one testing visit.  Talk with your health care provider about what your results  mean.  Contact your health care provider if you have pain, chills, a fever, or blood in your urine. This information is not intended to replace advice given to you by your health care provider. Make sure you discuss any questions you have with your health care provider. Document Released: 07/25/2007 Document Revised: 08/01/2017 Document Reviewed: 08/01/2017 Elsevier Interactive Patient Education  2019 [employer].

## 2018-11-27 NOTE — Progress Notes (Signed)
Continuous Intermittent Catheterization  Due to urinary retention patient is present today for a teaching of self I & O Catheterization. Patient was given detailed verbal and printed instructions of self catheterization. Patient was cleaned and prepped in a sterile fashion.  With instruction and assistance patient inserted a 14FR Coude and urine return was noted 750 ml, urine was clear in color. Patient tolerated well, no complications were noted Patient was given a sample bag with supplies to take home.  Instructions were given per Dr.Sninsky for patient to cath two times daily. No order for catheters was placed as patient states he has a large supply of catheters at home. Patient is to follow up after Urodynamic study.  Preformed by: Gordy Clement, [place] ([facility])

## 2018-12-05 ENCOUNTER — Other Ambulatory Visit: Payer: Self-pay | Admitting: Urology

## 2018-12-11 ENCOUNTER — Telehealth: Payer: Self-pay | Admitting: Radiology

## 2018-12-11 ENCOUNTER — Encounter: Payer: Self-pay | Admitting: Urology

## 2018-12-11 ENCOUNTER — Ambulatory Visit (INDEPENDENT_AMBULATORY_CARE_PROVIDER_SITE_OTHER): Payer: Medicare Other | Admitting: Urology

## 2018-12-11 ENCOUNTER — Other Ambulatory Visit: Payer: Self-pay | Admitting: Radiology

## 2018-12-11 VITALS — BP 142/74 | HR 88 | Ht 69.0 in | Wt 165.0 lb

## 2018-12-11 DIAGNOSIS — N138 Other obstructive and reflux uropathy: Secondary | ICD-10-CM | POA: Diagnosis not present

## 2018-12-11 DIAGNOSIS — N401 Enlarged prostate with lower urinary tract symptoms: Principal | ICD-10-CM

## 2018-12-11 DIAGNOSIS — R339 Retention of urine, unspecified: Secondary | ICD-10-CM

## 2018-12-11 DIAGNOSIS — R338 Other retention of urine: Principal | ICD-10-CM

## 2018-12-11 MED ORDER — SULFAMETHOXAZOLE-TRIMETHOPRIM 800-160 MG PO TABS: 1.0000 | ORAL_TABLET | Freq: Two times a day (BID) | ORAL | 0 refills | Status: AC

## 2018-12-11 NOTE — Progress Notes (Signed)
   12/11/2018 4:48 PM   Samuel Pitts July 18, 1946 [id number]  Reason for visit: Follow up UDS results, urinary retention  HPI: I saw Samuel Pitts in urology clinic to discuss his urodynamic results.  Briefly he is a 73 year old male who was hospitalized in December 2019 with severe sepsis from urinary source with urinary retention and bilateral hydroureteronephrosis.  He is failed multiple void trials as an outpatient, and his bladder is currently managed with clean intermittent catheterization.  He underwent urodynamics on 12/04/2018 to evaluate for atonic bladder versus bladder outlet obstruction.  This showed loss of compliance with high pressure low flow voiding, he was only able to void 20cc with PVR 477 cc.  Max detrusor pressure was 74 cm of water, and max flow rate was 2 mL/s.  Prostate volume on CT from December 2019 is 80g.  We discussed his options for bladder management including chronic Foley, ongoing CIC, or bladder outlet procedure.  I recommended HOLEP with his large prostate volume.  We discussed the risks and benefits of HOLEP at length.  The procedure requires general anesthesia and takes 2 to 3 hours, and a holmium laser is used to enucleate the prostate and push this tissue into the bladder.  A morcellator is then used to remove this tissue, which is sent for pathology.  Majority of patients are able to discharge the same day with a catheter in place for 2 to 3 days, and will follow-up in clinic for a voiding trial.  Approximately 10% of patients will be admitted overnight to monitor the urine, or if they have multiple comorbidities.  We specifically discussed the risks of bleeding, infection, retrograde ejaculation, temporary urgency and urge incontinence, very low risk of long-term incontinence, and possible need for additional procedures.  Schedule HOLEP  A total of 15 minutes were spent face-to-face with the patient, greater than 50% was spent in patient  education, counseling, and coordination of care regarding BPH, urodynamics results and HOLEP.   Billey Co, [facility] [address], [place], [place] [address] [phone]

## 2018-12-11 NOTE — Patient Instructions (Signed)
Continue to perform clean intermittent catheterization 2-3 times per day  Follow up for HOLEP surgery to open the prostate to allow you to urinate with a strong stream on your own again

## 2018-12-11 NOTE — Telephone Encounter (Signed)
Notified patient & daughter, Jackelyn Poling, of script for Bactrim DS sent to pharmacy & that medication needs to be started on 12/19/2018 per Dr Diamantina Providence. Instructions for dosing explained. Patient & daughter express understanding of conversation.

## 2018-12-11 NOTE — Telephone Encounter (Signed)
Patient was given the [facility] Information form below as well as the Instructions for Pre-Admission Testing form & a map of [facility].   [address] [place], [place] [address] Telephone: [phone] Fax: [phone]   Thank you for choosing [facility] for your upcoming surgery!  We are always here to assist in your urological needs.  Please read the following information with specific details for your upcoming appointments related to your surgery. Please contact Jamani Eley at [phone] Option 3 with any questions.  The Name of Your Surgery: Holmium laser enucleation of prostate Your Surgery Date: 12/22/2018 Your Surgeon: Nickolas Madrid  Please call Same Day Surgery at [phone] between the hours of 1pm-3pm one day prior to your surgery. They will inform you of the time to arrive at Same Day Surgery which is located on the second floor of the [facility].   Please refer to the attached letter regarding instructions for Pre-Admission Testing. You will receive a call from the [place] office regarding your appointment with them.  The Pre-Admission Testing office is located at [place], on the first floor of the [facility] in [place] (office is to the right as you enter through the [employer] of the [facility]). Please have all medications you are currently taking and your insurance card available.   Patient was advised to have nothing to eat or drink after midnight the night prior to surgery except that he may have only water until 2 hours before surgery with nothing to drink within 2 hours of surgery.  The patient states he currently takes no blood thinners.  Patient's questions were answered and he expressed understanding of these instructions.

## 2018-12-15 ENCOUNTER — Other Ambulatory Visit: Payer: Self-pay

## 2018-12-15 ENCOUNTER — Encounter
Admission: RE | Admit: 2018-12-15 | Discharge: 2018-12-15 | Disposition: A | Discharge status: Home / Self Care | Payer: Medicare Other | Source: Ambulatory Visit | Attending: Urology | Admitting: Urology

## 2018-12-15 DIAGNOSIS — Z01812 Encounter for preprocedural laboratory examination: Secondary | ICD-10-CM | POA: Insufficient documentation

## 2018-12-15 HISTORY — DX: Retention of urine, unspecified: R33.9

## 2018-12-15 HISTORY — DX: Anxiety disorder, unspecified: F41.9

## 2018-12-15 HISTORY — DX: Benign prostatic hyperplasia without lower urinary tract symptoms: N40.0

## 2018-12-15 HISTORY — DX: Essential (primary) hypertension: I10

## 2018-12-15 LAB — URINALYSIS, ROUTINE W REFLEX MICROSCOPIC
Bilirubin Urine: NEGATIVE
Glucose, UA: NEGATIVE mg/dL
Ketones, ur: NEGATIVE mg/dL
Nitrite: NEGATIVE
Protein, ur: NEGATIVE mg/dL
Specific Gravity, Urine: 1.005 (ref 1.005–1.030)
pH: 8 (ref 5.0–8.0)

## 2018-12-15 NOTE — Patient Instructions (Addendum)
Your procedure is scheduled on: 12/22/2018 Fri Report to Same Day Surgery 2nd floor medical mall [facility] Entrance-take elevator on left to 2nd floor.  Check in with surgery information desk.) To find out your arrival time please call [phone] between 1PM - 3PM on 12/21/2018 Thurs  Remember: Instructions that are not followed completely may result in serious medical risk, up to and including death, or upon the discretion of your surgeon and anesthesiologist your surgery may need to be rescheduled.    _x___ 1. Do not eat food after midnight the night before your procedure. You may drink clear liquids up to 2 hours before you are scheduled to arrive at the hospital for your procedure.  Do not drink clear liquids within 2 hours of your scheduled arrival to the hospital.  Clear liquids include  --Water or Apple juice without pulp  --Clear carbohydrate beverage such as ClearFast or Gatorade  --Black Coffee or Clear Tea (No milk, no creamers, do not add anything to                  the coffee or Tea Type 1 and type 2 diabetics should only drink water.   ____Ensure clear carbohydrate drink on the way to the hospital for bariatric patients  ____Ensure clear carbohydrate drink 3 hours before surgery for Dr Dwyane Luo patients if physician instructed.   No gum chewing or hard candies.     __x__ 2. No Alcohol for 24 hours before or after surgery.   __x__3. No Smoking or e-cigarettes for 24 prior to surgery.  Do not use any chewable tobacco products for at least 6 hour prior to surgery   ____  4. Bring all medications with you on the day of surgery if instructed.    __x__ 5. Notify your doctor if there is any change in your medical condition     (cold, fever, infections).    x___6. On the morning of surgery brush your teeth with toothpaste and water.  You may rinse your mouth with mouth wash if you wish.  Do not swallow any toothpaste or mouthwash.   Do not wear jewelry, make-up,  hairpins, clips or nail polish.  Do not wear lotions, powders, or perfumes. You may wear deodorant.  Do not shave 48 hours prior to surgery. Men may shave face and neck.  Do not bring valuables to the hospital.    [facility] is not responsible for any belongings or valuables.               Contacts, dentures or bridgework may not be worn into surgery.  Leave your suitcase in the car. After surgery it may be brought to your room.  For patients admitted to the hospital, discharge time is determined by your                       treatment team.  _  Patients discharged the day of surgery will not be allowed to drive home.  You will need someone to drive you home and stay with you the night of your procedure.    Please read over the following fact sheets that you were given:   [facility] Preparing for Surgery and or MRSA Information   _x___ Take anti-hypertensive listed below, cardiac, seizure, asthma,     anti-reflux and psychiatric medicines. These include:  1. amLODipine (NORVASC) 5 MG tablet  2.  3.  4.  5.  6.  ____Fleets enema or  Magnesium Citrate as directed.   ____ Use CHG Soap or sage wipes as directed on instruction sheet   ____ Use inhalers on the day of surgery and bring to hospital day of surgery  ____ Stop Metformin and Janumet 2 days prior to surgery.    ____ Take 1/2 of usual insulin dose the night before surgery and none on the morning     surgery.   _x___ Follow recommendations from Cardiologist, Pulmonologist or PCP regarding          stopping Aspirin, Coumadin, Plavix ,Eliquis, Effient, or Pradaxa, and Pletal.  X____Stop Anti-inflammatories such as Advil, Aleve, Ibuprofen, Motrin, Naproxen, Naprosyn, Goodies powders or aspirin products. OK to take Tylenol and                          Celebrex.   _x___ Stop supplements until after surgery.  But may continue Vitamin D, Vitamin B,       and multivitamin.   ____ Bring C-Pap to the hospital.

## 2018-12-16 NOTE — Pre-Procedure Instructions (Signed)
UA results sent to Dr. Sninsky for review. 

## 2018-12-17 LAB — URINE CULTURE: Culture: >=100000 — AB

## 2018-12-18 ENCOUNTER — Telehealth: Payer: Self-pay

## 2018-12-18 MED ORDER — SULFAMETHOXAZOLE-TRIMETHOPRIM 800-160 MG PO TABS: 1.0000 | ORAL_TABLET | Freq: Two times a day (BID) | ORAL | 0 refills | Status: DC

## 2018-12-18 NOTE — Telephone Encounter (Signed)
-----   Message from Billey Co, MD sent at 12/18/2018  7:59 AM EDT ----- Please start him on Bactrim DS BID x 5 days starting today in anticipation of surgery this Friday.  Thanks Nickolas Madrid, MD 12/18/2018

## 2018-12-18 NOTE — Telephone Encounter (Signed)
Left message on machine for patient to return call. Rx sent to pharmacy

## 2018-12-18 NOTE — Telephone Encounter (Signed)
Called patient and advised of culture results and Rx. Rx sent to pharmacy.

## 2018-12-19 ENCOUNTER — Telehealth: Payer: Self-pay | Admitting: Urology

## 2018-12-19 NOTE — Telephone Encounter (Signed)
Pt called office and states catheters are not working for him.  He wants to know if he can get some different catheters.  White box, packaged separately in a gray pouch.  Coloplast Cathflex G10

## 2018-12-20 ENCOUNTER — Telehealth: Payer: Self-pay

## 2018-12-20 ENCOUNTER — Telehealth: Payer: Self-pay | Admitting: Urology

## 2018-12-20 NOTE — Telephone Encounter (Signed)
Pt called and is wanting to ask a question about what medicine he should be taking for his upcoming surgery.

## 2018-12-20 NOTE — Telephone Encounter (Signed)
Left mess to call 

## 2018-12-20 NOTE — Telephone Encounter (Signed)
Patient showed up to office requesting different catheters- white box grey pouches requested. After showing catheters to patient, he insisted that they were the correct ones he could use. Dr Diamantina Providence requested he self cath in office to confirm. Patient refused and insisted they were correct. Advised patient to contact office after cathing to let us know either way. Patient verbalized consent and left the office.

## 2018-12-20 NOTE — Telephone Encounter (Signed)
Called patient to check on catheterization. He states that he has not been home yet to try but is adamant that he can and these are the correct catheters.

## 2018-12-21 ENCOUNTER — Ambulatory Visit: Payer: Medicare Other

## 2018-12-21 MED ORDER — CIPROFLOXACIN IN D5W 400 MG/200ML IV SOLN
400.0000 mg | INTRAVENOUS | Status: AC
  Administered 2018-12-22: 400 mg via INTRAVENOUS

## 2018-12-21 NOTE — Telephone Encounter (Signed)
Pt was advised to call back to clinic to inform if catheters given to pt yesterday worked or not, pt states they work just fine. FYI

## 2018-12-22 ENCOUNTER — Encounter: Payer: Self-pay | Admitting: *Deleted

## 2018-12-22 ENCOUNTER — Ambulatory Visit: Payer: Medicare Other | Admitting: Certified Registered"

## 2018-12-22 ENCOUNTER — Ambulatory Visit
Admission: RE | Admit: 2018-12-22 | Discharge: 2018-12-22 | Disposition: A | Discharge status: Home / Self Care | Payer: Medicare Other | Attending: Urology | Admitting: Urology

## 2018-12-22 ENCOUNTER — Encounter
Admission: RE | Disposition: A | Discharge status: Home / Self Care | Payer: Self-pay | Source: Home / Self Care | Attending: Urology

## 2018-12-22 DIAGNOSIS — N401 Enlarged prostate with lower urinary tract symptoms: Secondary | ICD-10-CM | POA: Insufficient documentation

## 2018-12-22 DIAGNOSIS — Z85820 Personal history of malignant melanoma of skin: Secondary | ICD-10-CM | POA: Diagnosis not present

## 2018-12-22 DIAGNOSIS — R339 Retention of urine, unspecified: Secondary | ICD-10-CM

## 2018-12-22 DIAGNOSIS — N138 Other obstructive and reflux uropathy: Secondary | ICD-10-CM

## 2018-12-22 DIAGNOSIS — R338 Other retention of urine: Secondary | ICD-10-CM | POA: Diagnosis not present

## 2018-12-22 DIAGNOSIS — I10 Essential (primary) hypertension: Secondary | ICD-10-CM | POA: Insufficient documentation

## 2018-12-22 DIAGNOSIS — N419 Inflammatory disease of prostate, unspecified: Secondary | ICD-10-CM | POA: Diagnosis not present

## 2018-12-22 HISTORY — PX: HOLEP-LASER ENUCLEATION OF THE PROSTATE WITH MORCELLATION: SHX6641

## 2018-12-22 SURGERY — ENUCLEATION, PROSTATE, USING LASER, WITH MORCELLATION
Anesthesia: General

## 2018-12-22 MED ORDER — FENTANYL CITRATE (PF) 100 MCG/2ML IJ SOLN: 25.0000 ug | INTRAMUSCULAR | Status: DC | PRN

## 2018-12-22 MED ORDER — DEXAMETHASONE SODIUM PHOSPHATE 10 MG/ML IJ SOLN
INTRAMUSCULAR | Status: DC | PRN
  Administered 2018-12-22: 10 mg via INTRAVENOUS

## 2018-12-22 MED ORDER — PROPOFOL 10 MG/ML IV BOLUS
INTRAVENOUS | Status: AC
  Filled 2018-12-22: qty 40

## 2018-12-22 MED ORDER — HYDROCODONE-ACETAMINOPHEN 5-325 MG PO TABS: 1.0000 | ORAL_TABLET | ORAL | 0 refills | Status: AC | PRN

## 2018-12-22 MED ORDER — SULFAMETHOXAZOLE-TRIMETHOPRIM 800-160 MG PO TABS: 1.0000 | ORAL_TABLET | Freq: Two times a day (BID) | ORAL | 0 refills | Status: AC

## 2018-12-22 MED ORDER — SUGAMMADEX SODIUM 200 MG/2ML IV SOLN
INTRAVENOUS | Status: DC | PRN
  Administered 2018-12-22: 200 mg via INTRAVENOUS

## 2018-12-22 MED ORDER — PHENYLEPHRINE HCL 10 MG/ML IJ SOLN
INTRAMUSCULAR | Status: DC | PRN
  Administered 2018-12-22 (×3): 200 ug via INTRAVENOUS

## 2018-12-22 MED ORDER — DEXAMETHASONE SODIUM PHOSPHATE 10 MG/ML IJ SOLN
INTRAMUSCULAR | Status: AC
  Filled 2018-12-22: qty 1

## 2018-12-22 MED ORDER — FENTANYL CITRATE (PF) 250 MCG/5ML IJ SOLN
INTRAMUSCULAR | Status: AC
  Filled 2018-12-22: qty 5

## 2018-12-22 MED ORDER — FENTANYL CITRATE (PF) 100 MCG/2ML IJ SOLN
INTRAMUSCULAR | Status: DC | PRN
  Administered 2018-12-22: 50 ug via INTRAVENOUS
  Administered 2018-12-22: 100 ug via INTRAVENOUS

## 2018-12-22 MED ORDER — ONDANSETRON HCL 4 MG/2ML IJ SOLN: 4.0000 mg | Freq: Once | INTRAMUSCULAR | Status: DC | PRN

## 2018-12-22 MED ORDER — ROCURONIUM BROMIDE 100 MG/10ML IV SOLN
INTRAVENOUS | Status: DC | PRN
  Administered 2018-12-22: 40 mg via INTRAVENOUS
  Administered 2018-12-22: 20 mg via INTRAVENOUS

## 2018-12-22 MED ORDER — EPHEDRINE SULFATE 50 MG/ML IJ SOLN
INTRAMUSCULAR | Status: AC
  Filled 2018-12-22: qty 1

## 2018-12-22 MED ORDER — BELLADONNA ALKALOIDS-OPIUM 16.2-60 MG RE SUPP
RECTAL | Status: DC | PRN
  Administered 2018-12-22: 1 via RECTAL

## 2018-12-22 MED ORDER — ONDANSETRON HCL 4 MG/2ML IJ SOLN
INTRAMUSCULAR | Status: AC
  Filled 2018-12-22: qty 2

## 2018-12-22 MED ORDER — SUGAMMADEX SODIUM 200 MG/2ML IV SOLN
INTRAVENOUS | Status: AC
  Filled 2018-12-22: qty 2

## 2018-12-22 MED ORDER — FAMOTIDINE 20 MG PO TABS
20.0000 mg | ORAL_TABLET | Freq: Once | ORAL | Status: AC
  Administered 2018-12-22: 20 mg via ORAL

## 2018-12-22 MED ORDER — SODIUM CHLORIDE 0.9 % IV SOLN
INTRAVENOUS | Status: DC | PRN
  Administered 2018-12-22: 50 ug/min via INTRAVENOUS

## 2018-12-22 MED ORDER — LACTATED RINGERS IV SOLN
INTRAVENOUS | Status: DC
  Administered 2018-12-22: 12:00:00 via INTRAVENOUS

## 2018-12-22 MED ORDER — MIDAZOLAM HCL 2 MG/2ML IJ SOLN
INTRAMUSCULAR | Status: DC | PRN
  Administered 2018-12-22: 2 mg via INTRAVENOUS

## 2018-12-22 MED ORDER — FAMOTIDINE 20 MG PO TABS
ORAL_TABLET | ORAL | Status: AC
  Filled 2018-12-22: qty 1

## 2018-12-22 MED ORDER — LIDOCAINE HCL (PF) 2 % IJ SOLN
INTRAMUSCULAR | Status: AC
  Filled 2018-12-22: qty 10

## 2018-12-22 MED ORDER — LIDOCAINE HCL (CARDIAC) PF 100 MG/5ML IV SOSY
PREFILLED_SYRINGE | INTRAVENOUS | Status: DC | PRN
  Administered 2018-12-22: 100 mg via INTRAVENOUS

## 2018-12-22 MED ORDER — EPHEDRINE SULFATE 50 MG/ML IJ SOLN
INTRAMUSCULAR | Status: DC | PRN
  Administered 2018-12-22: 10 mg via INTRAVENOUS
  Administered 2018-12-22 (×2): 5 mg via INTRAVENOUS

## 2018-12-22 MED ORDER — BELLADONNA ALKALOIDS-OPIUM 16.2-60 MG RE SUPP
RECTAL | Status: AC
  Filled 2018-12-22: qty 1

## 2018-12-22 MED ORDER — ROCURONIUM BROMIDE 50 MG/5ML IV SOLN
INTRAVENOUS | Status: AC
  Filled 2018-12-22: qty 2

## 2018-12-22 MED ORDER — CIPROFLOXACIN IN D5W 400 MG/200ML IV SOLN
INTRAVENOUS | Status: AC
  Filled 2018-12-22: qty 200

## 2018-12-22 MED ORDER — ONDANSETRON HCL 4 MG/2ML IJ SOLN
INTRAMUSCULAR | Status: DC | PRN
  Administered 2018-12-22: 4 mg via INTRAVENOUS

## 2018-12-22 MED ORDER — MIDAZOLAM HCL 2 MG/2ML IJ SOLN
INTRAMUSCULAR | Status: AC
  Filled 2018-12-22: qty 2

## 2018-12-22 MED ORDER — NYSTATIN 100000 UNIT/GM EX POWD
CUTANEOUS | Status: DC | PRN
  Filled 2018-12-22: qty 15

## 2018-12-22 MED ORDER — PROPOFOL 10 MG/ML IV BOLUS
INTRAVENOUS | Status: DC | PRN
  Administered 2018-12-22: 110 mg via INTRAVENOUS

## 2018-12-22 SURGICAL SUPPLY — 35 items
ADAPTER IRRIG TUBE 2 SPIKE SOL (ADAPTER) ×6 IMPLANT
BAG URINE DRAINAGE (UROLOGICAL SUPPLIES) IMPLANT
BAG URO DRAIN 4000ML (MISCELLANEOUS) ×3 IMPLANT
CATH FOL 2WAY LX 20X30 (CATHETERS) IMPLANT
CATH FOL 2WAY LX 22X30 (CATHETERS) IMPLANT
CATH FOLEY 3WAY 30CC 22FR (CATHETERS) IMPLANT
CATH FOLEY 3WAY 30CC 24FR (CATHETERS) ×2
CATH URETL 5X70 OPEN END (CATHETERS) ×3 IMPLANT
CATH URTH STD 24FR FL 3W 2 (CATHETERS) ×1 IMPLANT
CONTAINER COLLECT MORCELLATR (MISCELLANEOUS) ×1 IMPLANT
DRAPE SHEET LG 3/4 BI-LAMINATE (DRAPES) ×3 IMPLANT
DRAPE UTILITY 15X26 TOWEL STRL (DRAPES) ×3 IMPLANT
FILTER OVERFLOW MORCELLATOR (FILTER) ×1 IMPLANT
GLOVE BIOGEL PI IND STRL 7.5 (GLOVE) ×1 IMPLANT
GLOVE BIOGEL PI INDICATOR 7.5 (GLOVE) ×2
GOWN STRL REUS W/ TWL LRG LVL3 (GOWN DISPOSABLE) ×1 IMPLANT
GOWN STRL REUS W/ TWL XL LVL3 (GOWN DISPOSABLE) ×1 IMPLANT
GOWN STRL REUS W/TWL LRG LVL3 (GOWN DISPOSABLE) ×2
GOWN STRL REUS W/TWL XL LVL3 (GOWN DISPOSABLE) ×2
GUIDEWIRE STR DUAL SENSOR (WIRE) IMPLANT
HOLDER FOLEY CATH W/STRAP (MISCELLANEOUS) ×3 IMPLANT
KIT TURNOVER CYSTO (KITS) ×3 IMPLANT
LASER FIBER 550M SMARTSCOPE (Laser) ×6 IMPLANT
MORCELLATOR COLLECT CONTAINER (MISCELLANEOUS) ×3
MORCELLATOR OVERFLOW FILTER (FILTER) ×3
MORCELLATOR ROTATION 4.75 335 (MISCELLANEOUS) ×3 IMPLANT
PACK CYSTO AR (MISCELLANEOUS) ×3 IMPLANT
SET CYSTO W/LG BORE CLAMP LF (SET/KITS/TRAYS/PACK) ×3 IMPLANT
SET IRRIG Y TYPE TUR BLADDER L (SET/KITS/TRAYS/PACK) ×3 IMPLANT
SLEEVE PROTECTION STRL DISP (MISCELLANEOUS) ×6 IMPLANT
SOL .9 NS 3000ML IRR  AL (IV SOLUTION) ×16
SOL .9 NS 3000ML IRR UROMATIC (IV SOLUTION) ×8 IMPLANT
SYRINGE IRR TOOMEY STRL 70CC (SYRINGE) ×3 IMPLANT
TUBE PUMP MORCELLATOR PIRANHA (TUBING) ×3 IMPLANT
WATER STERILE IRR 1000ML POUR (IV SOLUTION) ×3 IMPLANT

## 2018-12-22 NOTE — Discharge Instructions (Signed)

## 2018-12-22 NOTE — Anesthesia Post-op Follow-up Note (Signed)
Anesthesia QCDR form completed.        

## 2018-12-22 NOTE — Op Note (Signed)
Date of procedure: 12/22/18  Preoperative diagnosis:  1. BPH with urinary retention  Postoperative diagnosis:  1. Same  Procedure: 1. HoLEP  Surgeon: Nickolas Madrid, MD  Anesthesia: General  Complications: None  Intraoperative findings:  1.  Moderate size prostate with high bladder neck, ureteral orifices orthotopic bilaterally, severe bladder trabeculations 2.  Uncomplicated HOLEP, excellent hemostasis  EBL: 25 cc  Specimens: Prostate chips  Enucleation time: 46 minutes  Morcellation time: 4 minutes  Intra-op weight: 21 g  Drains: 24 French three-way, 30 cc in balloon  Indication: Samuel Pitts is a 73 y.o. patient with BPH and urinary retention requiring Foley catheter and CIC for management.  Urodynamics demonstrated bladder outlet obstruction.  After reviewing the management options for treatment, they elected to proceed with the above surgical procedure(s). We have discussed the potential benefits and risks of the procedure, side effects of the proposed treatment, the likelihood of the patient achieving the goals of the procedure, and any potential problems that might occur during the procedure or recuperation.  We specifically discussed the risks of bleeding, infection, hematuria and clot retention, need for additional procedures, possible overnight hospital stay, temporary urgency and urge incontinence, and retrograde ejaculation.  Informed consent has been obtained.   Description of procedure:  The patient was taken to the operating room and general anesthesia was induced.  The patient was placed in the dorsal lithotomy position, prepped and draped in the usual sterile fashion, and preoperative antibiotics(Cipro) were administered.  SCDs were placed for DVT prophylaxis.  A preoperative time-out was performed.   The 44 French continuous flow resectoscope was inserted into the urethra using the visual obturator  The prostate was moderate in size with a high bladder  neck and lateral lobe hypertrophy. The bladder was thoroughly inspected and notable for severe bladder trabeculations.  The ureteral orifices were located in orthotopic position.  The laser was set to 2 J and 50 Hz and was used to make an incision at the 6 o'clock position to the level of the capsule from the bladder neck to the verumontanum.  The lateral lobes were then incised circumferentially until they were disconnected from the surrounding tissue.  The capsule was examined and laser was used for meticulous hemostasis.  The 80 French resectoscope was then switched out for the 14 French nephroscope and the lobes were morcellated and the tissue sent to pathology.  A 22 French three-way catheter was inserted easily with only a slight catch at the bladder neck, and CBI was initiated.  30 cc were placed in the balloon.  Urine was clear.  The patient tolerated the procedure well without any immediate complications and was extubated and transferred to the recovery room in stable condition.  Urine was clear on fast CBI.  Belladonna suppository was placed.  Disposition: Stable to PACU  Plan: Discharge from PACU if urine remains clear Continue Bactrim antibiotics over the weekend  Follow-up Monday for Foley removal  Nickolas Madrid, MD

## 2018-12-22 NOTE — Transfer of Care (Signed)
Immediate Anesthesia Transfer of Care Note  Patient: Samuel Pitts  Procedure(s) Performed: HOLEP-LASER ENUCLEATION OF THE PROSTATE WITH MORCELLATION (N/A )  Patient Location: PACU  Anesthesia Type:General  Level of Consciousness: drowsy  Airway & Oxygen Therapy: Patient Spontanous Breathing and Patient connected to face mask oxygen  Post-op Assessment: Report given to RN and Post -op Vital signs reviewed and stable  Post vital signs: stable  Last Vitals:  Vitals Value Taken Time  BP    Temp    Pulse    Resp    SpO2      Last Pain:  Vitals:   12/22/18 1203  TempSrc: Tympanic  PainSc: 0-No pain         Complications: No apparent anesthesia complications

## 2018-12-22 NOTE — Anesthesia Procedure Notes (Signed)
Procedure Name: Intubation Date/Time: 12/22/2018 12:50 PM Performed by: Lavone Orn, CRNA Pre-anesthesia Checklist: Patient identified, Emergency Drugs available, Suction available, Patient being monitored and Timeout performed Patient Re-evaluated:Patient Re-evaluated prior to induction Oxygen Delivery Method: Circle system utilized Preoxygenation: Pre-oxygenation with 100% oxygen Induction Type: IV induction Ventilation: Mask ventilation without difficulty and Oral airway inserted - appropriate to patient size Laryngoscope Size: Mac and 4 Grade View: Grade I Tube type: Oral Number of attempts: 1 Airway Equipment and Method: Stylet Placement Confirmation: ETT inserted through vocal cords under direct vision,  positive ETCO2 and breath sounds checked- equal and bilateral Secured at: 21 cm Tube secured with: Tape Dental Injury: Teeth and Oropharynx as per pre-operative assessment

## 2018-12-22 NOTE — H&P (Signed)
   12:12 PM   Samuel Pitts 22-Jul-1946 [id number]  CC: Urinary retention  HPI: Samuel Pitts is a 73 year old male with a history of BPH with urinary retention.  He does not get regular medical care, and was admitted to the hospital in December 2019 with urinary retention, bilateral hydroureteronephrosis, and urosepsis from E. coli.  He has been managing his bladder with CIC since then.  He has a long 1 to 2-year history of severe urinary symptoms and overflow incontinence prior to his admission to the hospital.  He denies any new fevers or chills.  He has been on culture appropriate antibiotics for Proteus UTI since 12/15/2018.  Prostate volume is 80 g.  Urodynamics shows high pressure low flow voiding consistent with bladder outlet obstruction.  PMH: Past Medical History:  Diagnosis Date  . Anxiety   . Hypertension   . Prostate enlargement   . Skin cancer    History of melanoma  . Urinary retention     Surgical History: Past Surgical History:  Procedure Laterality Date  . MELANOMA EXCISION    . TONSILLECTOMY      Allergies:  Allergies  Allergen Reactions  . Omeprazole     SEVERE STOMACH PAIN    Family History: No family history on file.  Social History:  reports that he has never smoked. He has never used smokeless tobacco. He reports that he does not drink alcohol or use drugs.  ROS: Negative aside from those stated in the HPI  Physical Exam: BP (!) 159/94   Pulse 95   Temp (!) 96.2 F (35.7 C) (Tympanic)   Resp 16   Ht 5\' 9"  (1.753 m)   Wt 74.8 kg   SpO2 100%   BMI 24.37 kg/m    Constitutional:  Alert and oriented, No acute distress. Cardiovascular: Regular rate and rhythm Respiratory: Clear to auscultation bilaterally GI: Abdomen is soft, nontender, nondistended, no abdominal masses GU: No CVA tenderness Lymph: No cervical or inguinal lymphadenopathy. Skin: No rashes, bruises or suspicious lesions. Neurologic: Grossly intact, no focal  deficits, moving all 4 extremities. Psychiatric: Normal mood and affect.  Laboratory Data: Urine culture 12/15/2018 Proteus, but on culture appropriate antibiotics  Pertinent Imaging: I have personally reviewed the CT, prostate measures 80 g.  Assessment & Plan:   In summary, the patient is a 73 year old male with severe urinary symptoms, overflow incontinence, and urinary retention resulting in hospitalization with bilateral hydroureteronephrosis and urosepsis in December 2019.  His bladder is been managed by CIC.  Urodynamics demonstrates a functional bladder.  He elected to undergo HOLEP.  We discussed the risks and benefits of HOLEP at length.  The procedure requires general anesthesia and takes 2 to 3 hours, and a holmium laser is used to enucleate the prostate and push this tissue into the bladder.  A morcellator is then used to remove this tissue, which is sent for pathology.  Majority of patients are able to discharge the same day with a catheter in place for 2 to 3 days, and will follow-up in clinic for a voiding trial.  Approximately 10% of patients will be admitted overnight to monitor the urine, or if they have multiple comorbidities.  We specifically discussed the risks of bleeding, infection, retrograde ejaculation, temporary urgency and urge incontinence, very low risk of long-term incontinence, and possible need for additional procedures.  Billey Co, [facility] [address] [place], [place] [address] [phone]

## 2018-12-22 NOTE — Anesthesia Preprocedure Evaluation (Signed)
Anesthesia Evaluation  Patient identified by MRN, date of birth, ID band Patient awake    Reviewed: Allergy & Precautions, H&P , NPO status , Patient's Chart, lab work & pertinent test results, reviewed documented beta blocker date and time   History of Anesthesia Complications Negative for: history of anesthetic complications  Airway Mallampati: I  TM Distance: >3 FB Neck ROM: full    Dental  (+) Dental Advidsory Given, Edentulous Upper, Edentulous Lower   Pulmonary neg pulmonary ROS,           Cardiovascular Exercise Tolerance: Good hypertension, (-) angina(-) CAD, (-) Past MI, (-) Cardiac Stents and (-) CABG (-) dysrhythmias (-) Valvular Problems/Murmurs     Neuro/Psych PSYCHIATRIC DISORDERS Anxiety negative neurological ROS     GI/Hepatic negative GI ROS, Neg liver ROS,   Endo/Other  negative endocrine ROS  Renal/GU negative Renal ROS  negative genitourinary   Musculoskeletal   Abdominal   Peds  Hematology negative hematology ROS (+)   Anesthesia Other Findings Past Medical History: No date: Anxiety No date: Hypertension No date: Prostate enlargement No date: Skin cancer     Comment:  History of melanoma No date: Urinary retention   Reproductive/Obstetrics negative OB ROS                             Anesthesia Physical Anesthesia Plan  ASA: II  Anesthesia Plan: General   Post-op Pain Management:    Induction: Intravenous  PONV Risk Score and Plan: 2 and Ondansetron, Dexamethasone and Treatment may vary due to age or medical condition  Airway Management Planned: Oral ETT  Additional Equipment:   Intra-op Plan:   Post-operative Plan: Extubation in OR  Informed Consent: I have reviewed the patients History and Physical, chart, labs and discussed the procedure including the risks, benefits and alternatives for the proposed anesthesia with the patient or authorized  representative who has indicated his/her understanding and acceptance.     Dental Advisory Given  Plan Discussed with: Anesthesiologist, CRNA and Surgeon  Anesthesia Plan Comments:         Anesthesia Quick Evaluation

## 2018-12-22 NOTE — Anesthesia Postprocedure Evaluation (Signed)
Anesthesia Post Note  Patient: Samuel Pitts  Procedure(s) Performed: HOLEP-LASER ENUCLEATION OF THE PROSTATE WITH MORCELLATION (N/A )  Patient location during evaluation: PACU Anesthesia Type: General Level of consciousness: awake and alert Pain management: pain level controlled Vital Signs Assessment: post-procedure vital signs reviewed and stable Respiratory status: spontaneous breathing and respiratory function stable Cardiovascular status: stable Anesthetic complications: no     Last Vitals:  Vitals:   12/22/18 1530 12/22/18 1542  BP: 133/83 138/83  Pulse: 73 73  Resp: 11 14  Temp: 36.7 C (!) 36.2 C  SpO2: 96% 99%    Last Pain:  Vitals:   12/22/18 1542  TempSrc: Temporal  PainSc: 0-No pain                 Aletheia Tangredi K

## 2018-12-24 ENCOUNTER — Encounter: Payer: Self-pay | Admitting: Urology

## 2018-12-26 ENCOUNTER — Other Ambulatory Visit: Payer: Self-pay

## 2018-12-26 ENCOUNTER — Telehealth: Payer: Self-pay

## 2018-12-26 ENCOUNTER — Ambulatory Visit (INDEPENDENT_AMBULATORY_CARE_PROVIDER_SITE_OTHER): Payer: Medicare Other | Admitting: Family Medicine

## 2018-12-26 DIAGNOSIS — R339 Retention of urine, unspecified: Secondary | ICD-10-CM

## 2018-12-26 LAB — SURGICAL PATHOLOGY

## 2018-12-26 NOTE — Telephone Encounter (Signed)
-----   Message from Billey Co, MD sent at 12/26/2018  1:09 PM EDT ----- No prostate cancer seen in his prostate chips from surgery, this is great news. Keep scheduled follow up in 4-6 weeks for PVR  Nickolas Madrid, MD 12/26/2018

## 2018-12-26 NOTE — Progress Notes (Signed)
Fill and Pull Catheter Removal  Patient is present today for a catheter removal.  Patient was cleaned and prepped in a sterile fashion 165ml of sterile water/ saline was instilled into the bladder when the patient felt the urge to urinate. 73ml of water was then drained from the balloon.  A 24FR foley cath was removed from the bladder no complications were noted .  Patient as then given some time to void on their own.  Patient can void  161ml on their own after some time.  Patient tolerated well.  Preformed by: Elberta Leatherwood, CMA  Follow up/ Additional notes: If unable to urinate return this afternoon for PVR.

## 2018-12-26 NOTE — Telephone Encounter (Signed)
Patient notified

## 2019-02-01 ENCOUNTER — Encounter: Payer: Self-pay | Admitting: Urology

## 2019-02-01 ENCOUNTER — Ambulatory Visit: Payer: Medicare Other | Admitting: Urology

## 2019-02-01 ENCOUNTER — Telehealth: Payer: Self-pay | Admitting: *Deleted

## 2019-02-01 NOTE — Telephone Encounter (Signed)
Spoke with patient regarding his missed post op appointment today. He is doing well-denies any urinary issues.

## 2019-02-01 NOTE — Telephone Encounter (Signed)
Please reschedule follow up for PVR in 2 months.  Nickolas Madrid, MD 02/01/2019

## 2019-04-05 ENCOUNTER — Encounter: Payer: Self-pay | Admitting: Urology

## 2019-04-05 ENCOUNTER — Ambulatory Visit: Payer: Medicare Other | Admitting: Urology

## 2020-04-25 IMAGING — DX DG CHEST 1V PORT
1 series · 1 of 1 positions shown · non-contrast
Comparison: None.

CLINICAL DATA: Fever.

EXAM:
PORTABLE CHEST 1 VIEW

[chest ap]
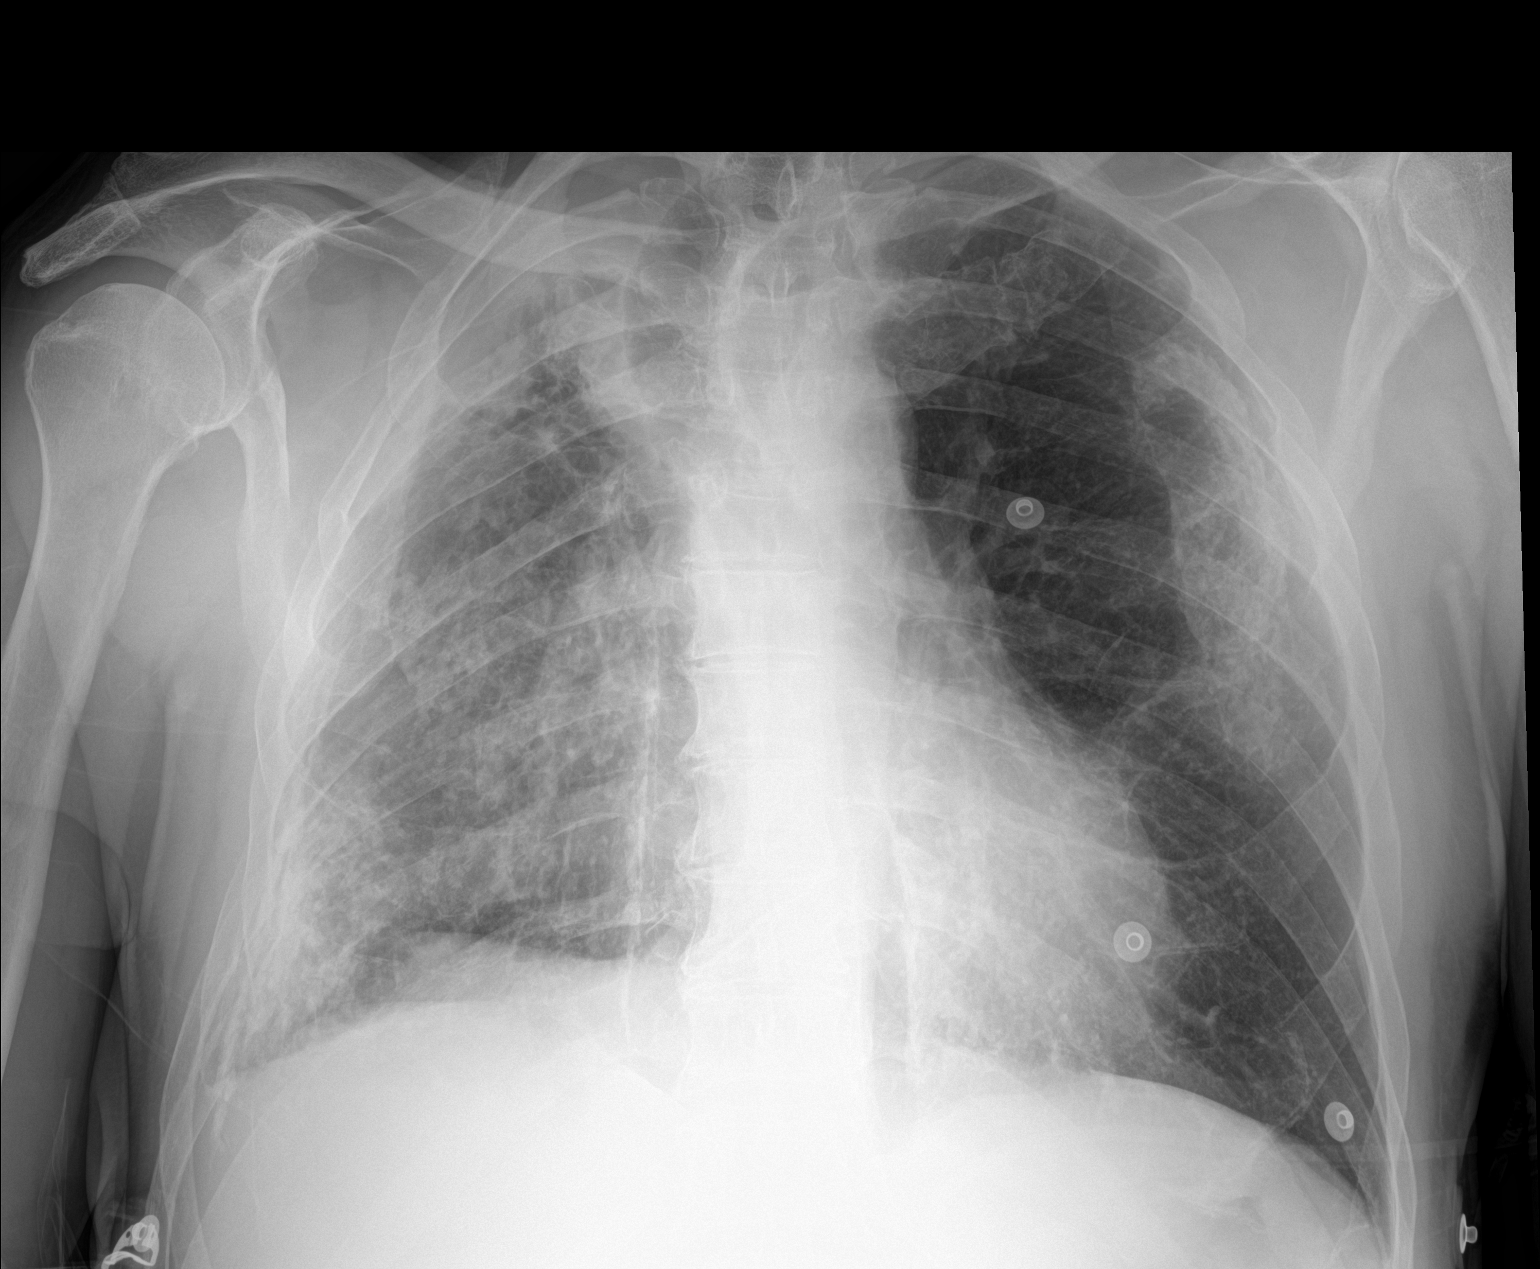

[1 of 1 positions shown; findings below may reference images not displayed]

FINDINGS: Cardiomediastinal silhouette is normal. Trachea shifted to the RIGHT
with RIGHT apical pleural thickening. Bilateral calcified pleural
plaques and RIGHT lung pleural thickening. No pleural effusion or
focal consolidation. Small potential pneumothorax. Soft tissue
planes and included osseous structures are non suspicious.
IMPRESSION: 1. Small potential RIGHT pneumothorax. Calcified pleural plaques
which may obscure underlying acute process. RIGHT apical pleural
thickening and scarring.
2. Acute findings discussed with and reconfirmed by Dr.EDIJA AKIRA

## 2021-06-30 ENCOUNTER — Encounter: Payer: Self-pay | Admitting: Emergency Medicine

## 2021-06-30 ENCOUNTER — Other Ambulatory Visit: Payer: Self-pay

## 2021-06-30 ENCOUNTER — Emergency Department: Payer: Medicare Other

## 2021-06-30 ENCOUNTER — Emergency Department
Admission: EM | Admit: 2021-06-30 | Discharge: 2021-06-30 | Disposition: A | Discharge status: Home / Self Care | Payer: Medicare Other | Attending: Emergency Medicine | Admitting: Emergency Medicine

## 2021-06-30 DIAGNOSIS — Z79899 Other long term (current) drug therapy: Secondary | ICD-10-CM | POA: Insufficient documentation

## 2021-06-30 DIAGNOSIS — I10 Essential (primary) hypertension: Secondary | ICD-10-CM | POA: Insufficient documentation

## 2021-06-30 DIAGNOSIS — Z85828 Personal history of other malignant neoplasm of skin: Secondary | ICD-10-CM | POA: Insufficient documentation

## 2021-06-30 DIAGNOSIS — R140 Abdominal distension (gaseous): Secondary | ICD-10-CM | POA: Insufficient documentation

## 2021-06-30 DIAGNOSIS — R109 Unspecified abdominal pain: Secondary | ICD-10-CM | POA: Diagnosis not present

## 2021-06-30 LAB — CBC WITH DIFFERENTIAL/PLATELET
Abs Immature Granulocytes: 0.03 10*3/uL (ref 0.00–0.07)
Basophils Absolute: 0.1 10*3/uL (ref 0.0–0.1)
Basophils Relative: 1 %
Eosinophils Absolute: 0 10*3/uL (ref 0.0–0.5)
Eosinophils Relative: 0 %
HCT: 44.8 % (ref 39.0–52.0)
Hemoglobin: 15.1 g/dL (ref 13.0–17.0)
Immature Granulocytes: 0 %
Lymphocytes Relative: 13 %
Lymphs Abs: 1.1 10*3/uL (ref 0.7–4.0)
MCH: 30.9 pg (ref 26.0–34.0)
MCHC: 33.7 g/dL (ref 30.0–36.0)
MCV: 91.6 fL (ref 80.0–100.0)
Monocytes Absolute: 1 10*3/uL (ref 0.1–1.0)
Monocytes Relative: 11 %
Neutro Abs: 6.8 10*3/uL (ref 1.7–7.7)
Neutrophils Relative %: 75 %
Platelets: 244 10*3/uL (ref 150–400)
RBC: 4.89 MIL/uL (ref 4.22–5.81)
RDW: 13.3 % (ref 11.5–15.5)
WBC: 9 10*3/uL (ref 4.0–10.5)
nRBC: 0 % (ref 0.0–0.2)

## 2021-06-30 LAB — COMPREHENSIVE METABOLIC PANEL
ALT: 28 U/L (ref 0–44)
AST: 31 U/L (ref 15–41)
Albumin: 4.1 g/dL (ref 3.5–5.0)
Alkaline Phosphatase: 59 U/L (ref 38–126)
Anion gap: 8 (ref 5–15)
BUN: 16 mg/dL (ref 8–23)
CO2: 28 mmol/L (ref 22–32)
Calcium: 9.5 mg/dL (ref 8.9–10.3)
Chloride: 99 mmol/L (ref 98–111)
Creatinine, Ser: 1.16 mg/dL (ref 0.61–1.24)
GFR, Estimated: >60 mL/min (ref >60)
Glucose, Bld: 108 mg/dL — ABNORMAL HIGH (ref 70–99)
Potassium: 4.3 mmol/L (ref 3.5–5.1)
Sodium: 135 mmol/L (ref 135–145)
Total Bilirubin: 1.1 mg/dL (ref 0.3–1.2)
Total Protein: 7.8 g/dL (ref 6.5–8.1)

## 2021-06-30 LAB — LIPASE, BLOOD: Lipase: 33 U/L (ref 11–51)

## 2021-06-30 NOTE — ED Notes (Signed)
Pt NAD, a/ox4. Pt verbalizes understanding of all DC and f/u instructions. All questions answered. Pt walks with steady gait to lobby at DC.  ? ?

## 2021-06-30 NOTE — ED Provider Notes (Signed)
Emergency Medicine Provider Triage Evaluation Note  Samuel Pitts , a 75 y.o. male  was evaluated in triage.  Pt complains of abdominal pain, flatulence, burping.  Constipated yesterday.  Very small bowel movement today.  Review of Systems  Positive: Abdominal pressure, burping, constipated Negative: Denies chest pain, shortness of breath, vomiting or diarrhea  Physical Exam  BP (!) 151/81 (BP Location: Left Arm)   Pulse 70   Temp 97.6 F (36.4 C) (Oral)   Resp 20   SpO2 98%  Gen:   Awake, no distress   Resp:  Normal effort  MSK:   Moves extremities without difficulty  Other:  Abdomen is nontender, bowel sounds present  Medical Decision Making  Medically screening exam initiated at 2:32 PM.  Appropriate orders placed.  Worley Radermacher was informed that the remainder of the evaluation will be completed by another provider, this initial triage assessment does not replace that evaluation, and the importance of remaining in the ED until their evaluation is complete.     Versie Starks, PA-C 06/30/21 1433    Carrie Mew, MD 06/30/21 [phone]

## 2021-06-30 NOTE — ED Triage Notes (Signed)
Pt reports that he has been gassy for the last few days. He reports bilat lower quad abd pain that comes and goes. He thinks that he may be constipated. He feels bloated

## 2021-06-30 NOTE — ED Provider Notes (Signed)
[facility] Emergency Department Provider Note  ____________________________________________  Time seen: Approximately 7:50 PM  I have reviewed the triage vital signs and the nursing notes.   HISTORY  Chief Complaint Constipation and Abdominal Pain    HPI Samuel Pitts is a 75 y.o. male with a history of anxiety, hypertension who comes ED complaining of migrating abdominal pain over the last 2 days.  Intermittent, waxing and waning, no aggravating or alleviating factors, predominantly at the left flank radiating to left lower quadrant.  No nausea vomiting or diarrhea.  He has bloated with increased gas.  No dysuria  He reports that since arriving at the emergency department, his symptoms have resolved.  He has been pain-free for the last 6 hours.  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 Authorizing Provider  amLODipine (NORVASC) 5 MG tablet Take 1 tablet (5 mg total) by mouth daily. 11/14/18 11/14/19  Merlyn Lot, MD  fluconazole (DIFLUCAN) 100 MG tablet Take 1 tablet (100 mg total) by mouth daily. X 7 days Patient not taking: Reported on 12/14/2018 10/16/18   Billey Co, MD  sulfamethoxazole-trimethoprim (BACTRIM DS,SEPTRA DS) 800-160 MG tablet Take 1 tablet by mouth 2 (two) times daily. 12/22/18   Billey Co, MD  tamsulosin (FLOMAX) 0.4  MG CAPS capsule Take 1 capsule (0.4 mg total) by mouth daily after supper. Patient not taking: Reported on 12/14/2018 10/16/18   Billey Co, MD     Allergies Omeprazole   No family history on file.  Social History Social History   Tobacco Use   Smoking status: Never   Smokeless tobacco: Never  Vaping Use   Vaping Use: Never used  Substance Use Topics   Alcohol use: Never   Drug use: Never    Review of Systems  Constitutional:   No fever or chills.  ENT:   No sore throat. No rhinorrhea. Cardiovascular:   No chest pain or syncope. Respiratory:   No dyspnea or cough. Gastrointestinal:   Positive abdominal pain as above without vomiting and diarrhea.  Musculoskeletal:   Negative for focal pain or swelling All other systems reviewed and are negative except as documented above in ROS and HPI.  ____________________________________________   PHYSICAL EXAM:  VITAL SIGNS: ED Triage Vitals  Enc Vitals Group     BP 06/30/21 1431 (!) 151/81     Pulse Rate 06/30/21 1431 70     Resp 06/30/21 1431 20     Temp 06/30/21 1431 97.6 F (36.4 C)     Temp Source 06/30/21 1431 Oral     SpO2 06/30/21 1431 98 %     Weight 06/30/21 1432 175 lb (79.4 kg)     Height 06/30/21 1432 5\' 9"  (1.753 m)     Head Circumference --  Peak Flow --      Pain Score 06/30/21 1431 6     Pain Loc --      Pain Edu? --      Excl. in [place]? --     Vital signs reviewed, nursing assessments reviewed.   Constitutional:   Alert and oriented. Non-toxic appearance. Eyes:   Conjunctivae are normal. EOMI. PERRL. ENT      Head:   Normocephalic and atraumatic.      Nose:   Wearing a mask.      Mouth/Throat:   Wearing a mask.      Neck:   No meningismus. Full ROM. Hematological/Lymphatic/Immunilogical:   No cervical lymphadenopathy. Cardiovascular:   RRR. Symmetric bilateral radial and DP pulses.  No murmurs. Cap refill less than 2 seconds. Respiratory:   Normal respiratory effort without  tachypnea/retractions. Breath sounds are clear and equal bilaterally. No wheezes/rales/rhonchi. Gastrointestinal:   Soft and nontender. Non distended. There is no CVA tenderness.  No rebound, rigidity, or guarding. Genitourinary:   deferred Musculoskeletal:   Normal range of motion in all extremities. No joint effusions.  No lower extremity tenderness.  No edema. Neurologic:   Normal speech and language.  Motor grossly intact. No acute focal neurologic deficits are appreciated.  Skin:    Skin is warm, dry and intact. No rash noted.  No petechiae, purpura, or bullae.  ____________________________________________    LABS (pertinent positives/negatives) (all labs ordered are listed, but only abnormal results are displayed) Labs Reviewed  COMPREHENSIVE METABOLIC PANEL - Abnormal; Notable for the following components:      Result Value   Glucose, Bld 108 (*)    All other components within normal limits  LIPASE, BLOOD  CBC WITH DIFFERENTIAL/PLATELET  URINALYSIS, COMPLETE (UACMP) WITH MICROSCOPIC   ____________________________________________   EKG    ____________________________________________    RADIOLOGY  DG Abdomen 1 View  Result Date: 06/30/2021 CLINICAL DATA:  75 year old male with reported history of constipation and bloating. EXAM: ABDOMEN - 1 VIEW COMPARISON:  None FINDINGS: Stool and gas scattered throughout the colon. No signs of bowel obstruction. Note that the upper abdomen is excluded from view, abdomen image from top of T12 through the pelvis. There are gas-filled loops of small bowel scattered throughout the abdomen without signs of dilation. No abnormal calcifications over visualized abdomen. No acute skeletal process on limited assessment. IMPRESSION: No sign of obstruction. Moderate amount of stool and gas scattered throughout the colon. Findings are not compatible with imaging findings of constipation. Note that upper abdomen is excluded from view on the current  evaluation. Electronically Signed   By: Zetta Bills M.D.   On: 06/30/2021 15:18    ____________________________________________   PROCEDURES Procedures  ____________________________________________    CLINICAL IMPRESSION / ASSESSMENT AND PLAN / ED COURSE  Medications ordered in the ED: Medications - No data to display  Pertinent labs & imaging results that were available during my care of the patient were reviewed by me and considered in my medical decision making (see chart for details).  Samuel Pitts was evaluated in Emergency Department on 06/30/2021 for the symptoms described in the history of present illness. He was evaluated in the context of the global COVID-19 pandemic, which necessitated consideration that the patient might be at risk for infection with the SARS-CoV-2 virus that causes COVID-19. Institutional protocols and algorithms that pertain to the evaluation of patients at risk for COVID-19 are in a state of rapid change based on information released by regulatory bodies including  the [employer] and federal and state organizations. These policies and algorithms were followed during the patient's care in the ED.   Patient presents with nonspecific abdominal pain over the last 2 days, now resolved.  Vitals are unremarkable, exam is normal, labs are normal.  Abdominal x-ray is unremarkable without evidence of obstruction.  Suspect renal colic versus gas pain.   Considering the patient's symptoms, medical history, and physical examination today, I have low suspicion for cholecystitis or biliary pathology, pancreatitis, perforation or bowel obstruction, hernia, intra-abdominal abscess, AAA or dissection, volvulus or intussusception, mesenteric ischemia, or appendicitis.       ____________________________________________   FINAL CLINICAL IMPRESSION(S) / ED DIAGNOSES    Final diagnoses:  Abdominal pain, unspecified abdominal location     ED Discharge Orders      None       Portions of this note were generated with dragon dictation software. Dictation errors may occur despite best attempts at proofreading.    Carrie Mew, MD 06/30/21 [phone]

## 2023-02-08 IMAGING — CR DG ABDOMEN 1V
2 series · 2 of 2 positions shown · non-contrast
Comparison: None

CLINICAL DATA: 74-year-old male with reported history of
constipation and bloating.

EXAM:
ABDOMEN - 1 VIEW

[abdomen kub (1 of 2)]
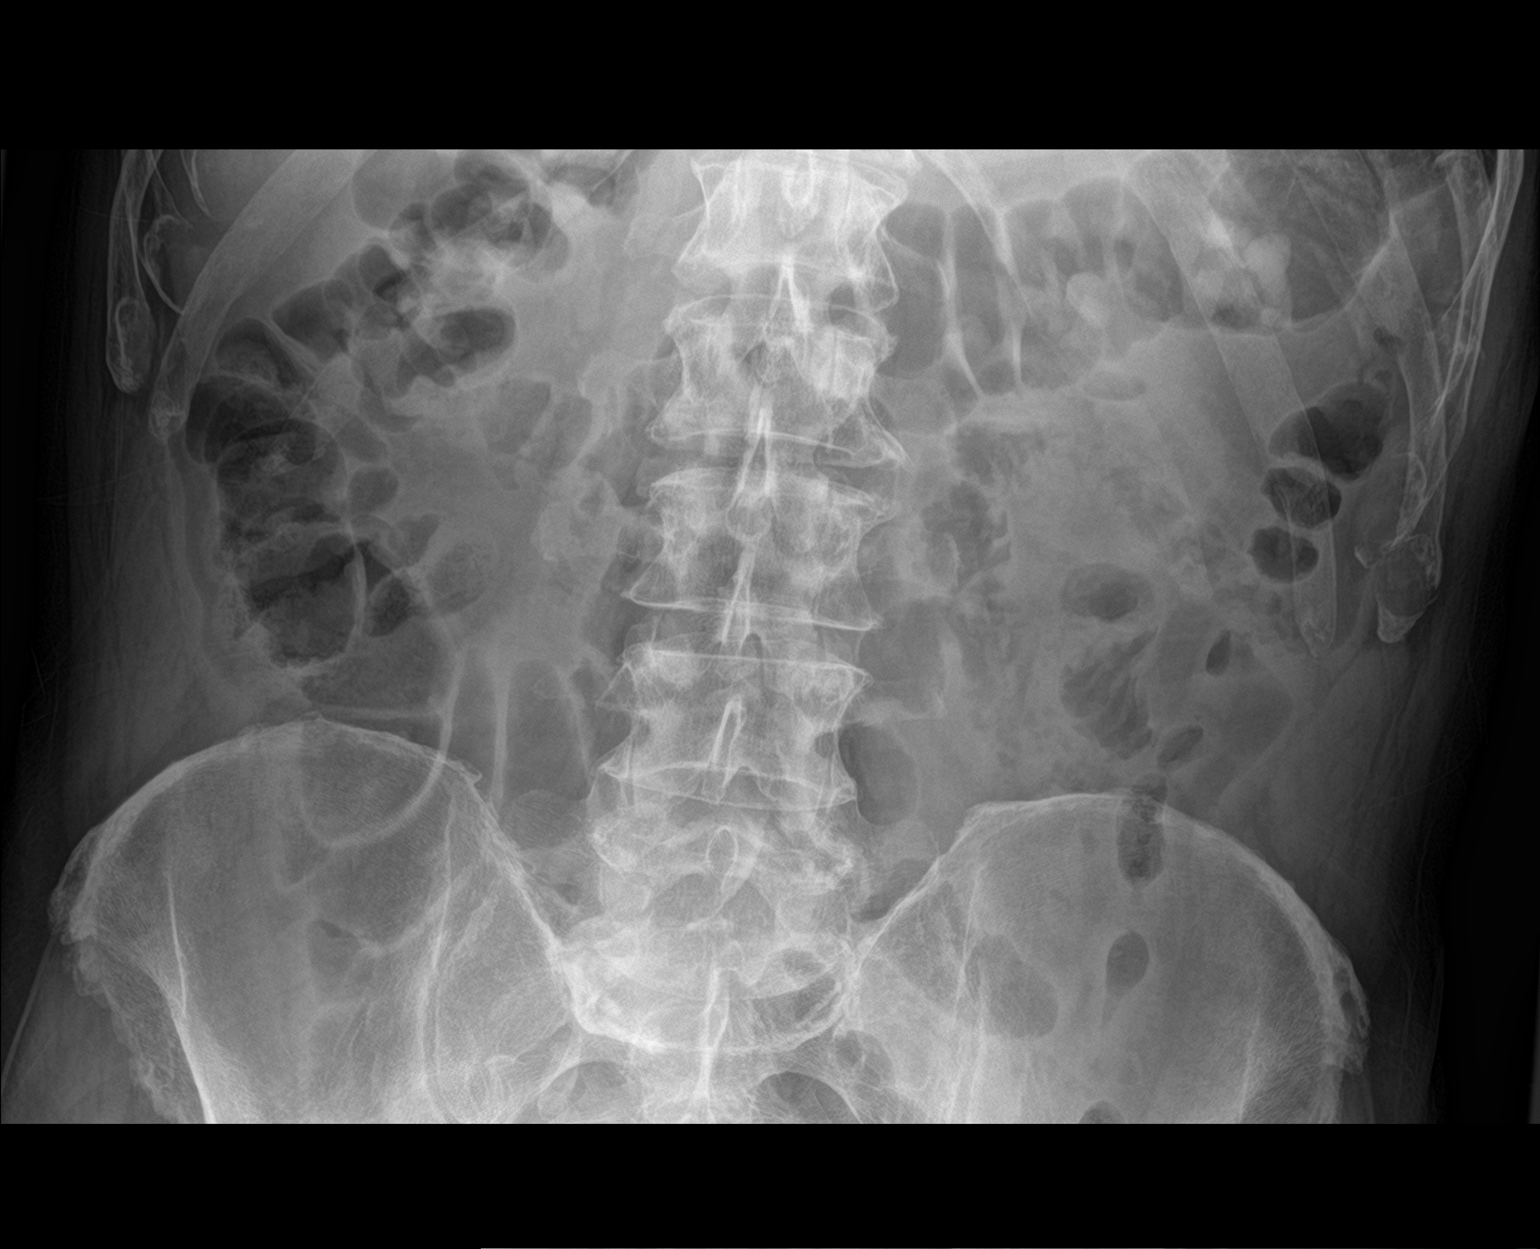

[abdomen kub (2 of 2)]
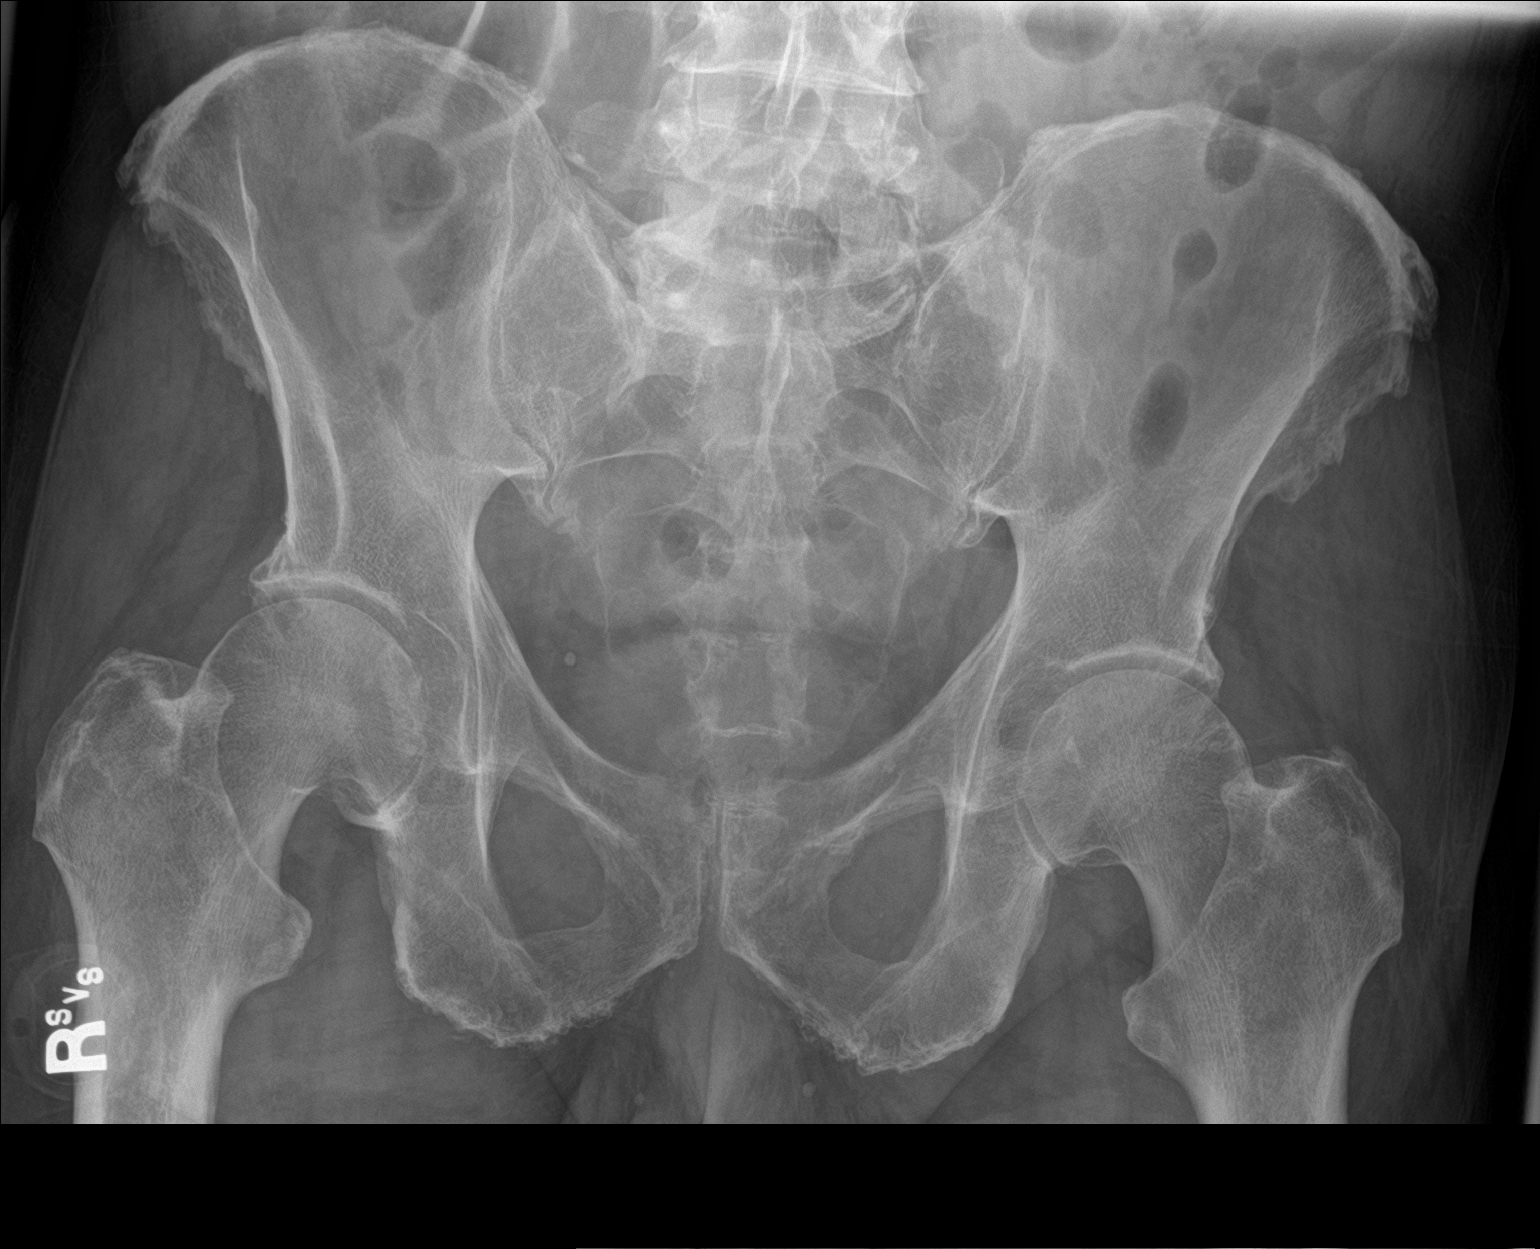

[2 of 2 positions shown; findings below may reference images not displayed]

FINDINGS: Stool and gas scattered throughout the colon. No signs of bowel
obstruction. Note that the upper abdomen is excluded from view,
abdomen image from top of T12 through the pelvis.

There are gas-filled loops of small bowel scattered throughout the
abdomen without signs of dilation.

No abnormal calcifications over visualized abdomen.

No acute skeletal process on limited assessment.
IMPRESSION: No sign of obstruction.

Moderate amount of stool and gas scattered throughout the colon.
Findings are not compatible with imaging findings of constipation.

Note that upper abdomen is excluded from view on the current
evaluation.
